# Patient Record
Sex: Female | Born: 1960 | Race: White | Hispanic: No | Marital: Married | Smoking: Never smoker
Health system: Southern US, Community
[De-identification: ages and names within clinical notes are randomized; demographics above are authoritative.]

## PROBLEM LIST (undated history)

## (undated) DIAGNOSIS — G510 Bell's palsy: Secondary | ICD-10-CM

## (undated) DIAGNOSIS — E669 Obesity, unspecified: Secondary | ICD-10-CM

## (undated) DIAGNOSIS — G4700 Insomnia, unspecified: Secondary | ICD-10-CM

## (undated) DIAGNOSIS — R5382 Chronic fatigue, unspecified: Secondary | ICD-10-CM

## (undated) DIAGNOSIS — F32A Depression, unspecified: Secondary | ICD-10-CM

## (undated) DIAGNOSIS — J329 Chronic sinusitis, unspecified: Secondary | ICD-10-CM

## (undated) DIAGNOSIS — F419 Anxiety disorder, unspecified: Secondary | ICD-10-CM

## (undated) DIAGNOSIS — E559 Vitamin D deficiency, unspecified: Secondary | ICD-10-CM

## (undated) DIAGNOSIS — E7800 Pure hypercholesterolemia, unspecified: Secondary | ICD-10-CM

## (undated) DIAGNOSIS — I10 Essential (primary) hypertension: Secondary | ICD-10-CM

## (undated) HISTORY — PX: BREAST BIOPSY: SHX20

## (undated) HISTORY — DX: Chronic sinusitis, unspecified: J32.9

## (undated) HISTORY — DX: Obesity, unspecified: E66.9

## (undated) HISTORY — DX: Vitamin D deficiency, unspecified: E55.9

## (undated) HISTORY — DX: Insomnia, unspecified: G47.00

## (undated) HISTORY — DX: Anxiety disorder, unspecified: F41.9

## (undated) HISTORY — PX: REDUCTION MAMMAPLASTY: SUR839

## (undated) HISTORY — DX: Pure hypercholesterolemia, unspecified: E78.00

## (undated) HISTORY — PX: TONSILLECTOMY: SUR1361

## (undated) HISTORY — DX: Chronic fatigue, unspecified: R53.82

---
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
## 1996-10-27 HISTORY — PX: SMALL INTESTINE SURGERY: SHX150

## 1998-05-21 ENCOUNTER — Inpatient Hospital Stay (HOSPITAL_COMMUNITY): Admission: AD | Admit: 1998-05-21 | Discharge: 1998-05-23 | Payer: Self-pay | Admitting: Obstetrics and Gynecology

## 1998-07-23 ENCOUNTER — Other Ambulatory Visit: Admission: RE | Admit: 1998-07-23 | Discharge: 1998-07-23 | Payer: Self-pay | Admitting: Obstetrics and Gynecology

## 1998-09-07 ENCOUNTER — Other Ambulatory Visit: Admission: RE | Admit: 1998-09-07 | Discharge: 1998-09-07 | Payer: Self-pay | Admitting: Obstetrics and Gynecology

## 1999-03-05 ENCOUNTER — Other Ambulatory Visit: Admission: RE | Admit: 1999-03-05 | Discharge: 1999-03-05 | Payer: Self-pay | Admitting: Obstetrics and Gynecology

## 2000-11-19 ENCOUNTER — Other Ambulatory Visit: Admission: RE | Admit: 2000-11-19 | Discharge: 2000-11-19 | Payer: Self-pay | Admitting: Obstetrics and Gynecology

## 2000-11-26 ENCOUNTER — Encounter: Payer: Self-pay | Admitting: Obstetrics and Gynecology

## 2000-11-26 ENCOUNTER — Encounter: Admission: RE | Admit: 2000-11-26 | Discharge: 2000-11-26 | Payer: Self-pay | Admitting: Obstetrics and Gynecology

## 2001-01-04 ENCOUNTER — Other Ambulatory Visit: Admission: RE | Admit: 2001-01-04 | Discharge: 2001-01-04 | Payer: Self-pay | Admitting: Obstetrics and Gynecology

## 2001-01-04 ENCOUNTER — Encounter (INDEPENDENT_AMBULATORY_CARE_PROVIDER_SITE_OTHER): Payer: Self-pay

## 2001-01-12 ENCOUNTER — Other Ambulatory Visit: Admission: RE | Admit: 2001-01-12 | Discharge: 2001-01-12 | Payer: Self-pay

## 2001-12-15 ENCOUNTER — Other Ambulatory Visit: Admission: RE | Admit: 2001-12-15 | Discharge: 2001-12-15 | Payer: Self-pay | Admitting: Obstetrics and Gynecology

## 2002-05-17 ENCOUNTER — Encounter: Admission: RE | Admit: 2002-05-17 | Discharge: 2002-05-17 | Payer: Self-pay | Admitting: Obstetrics and Gynecology

## 2002-05-17 ENCOUNTER — Encounter: Payer: Self-pay | Admitting: Obstetrics and Gynecology

## 2002-07-20 ENCOUNTER — Encounter: Payer: Self-pay | Admitting: Obstetrics and Gynecology

## 2002-07-20 ENCOUNTER — Ambulatory Visit (HOSPITAL_COMMUNITY): Admission: RE | Admit: 2002-07-20 | Discharge: 2002-07-20 | Payer: Self-pay | Admitting: Obstetrics and Gynecology

## 2003-12-07 ENCOUNTER — Encounter: Admission: RE | Admit: 2003-12-07 | Discharge: 2003-12-07 | Payer: Self-pay | Admitting: Obstetrics and Gynecology

## 2004-04-10 ENCOUNTER — Other Ambulatory Visit: Admission: RE | Admit: 2004-04-10 | Discharge: 2004-04-10 | Payer: Self-pay | Admitting: Obstetrics and Gynecology

## 2004-05-08 ENCOUNTER — Encounter: Admission: RE | Admit: 2004-05-08 | Discharge: 2004-08-06 | Payer: Self-pay | Admitting: Obstetrics and Gynecology

## 2004-12-27 ENCOUNTER — Encounter: Admission: RE | Admit: 2004-12-27 | Discharge: 2004-12-27 | Payer: Self-pay | Admitting: Obstetrics and Gynecology

## 2005-05-02 ENCOUNTER — Other Ambulatory Visit: Admission: RE | Admit: 2005-05-02 | Discharge: 2005-05-02 | Payer: Self-pay | Admitting: Obstetrics and Gynecology

## 2006-01-13 ENCOUNTER — Encounter: Admission: RE | Admit: 2006-01-13 | Discharge: 2006-01-13 | Payer: Self-pay | Admitting: Obstetrics and Gynecology

## 2006-05-05 ENCOUNTER — Other Ambulatory Visit: Admission: RE | Admit: 2006-05-05 | Discharge: 2006-05-05 | Payer: Self-pay | Admitting: Obstetrics and Gynecology

## 2007-02-17 ENCOUNTER — Encounter: Admission: RE | Admit: 2007-02-17 | Discharge: 2007-02-17 | Payer: Self-pay | Admitting: Obstetrics and Gynecology

## 2007-06-24 ENCOUNTER — Other Ambulatory Visit: Admission: RE | Admit: 2007-06-24 | Discharge: 2007-06-24 | Payer: Self-pay | Admitting: Obstetrics and Gynecology

## 2008-02-24 ENCOUNTER — Encounter: Admission: RE | Admit: 2008-02-24 | Discharge: 2008-02-24 | Payer: Self-pay | Admitting: Obstetrics and Gynecology

## 2008-07-14 ENCOUNTER — Other Ambulatory Visit: Admission: RE | Admit: 2008-07-14 | Discharge: 2008-07-14 | Payer: Self-pay | Admitting: Obstetrics and Gynecology

## 2009-03-20 ENCOUNTER — Encounter: Admission: RE | Admit: 2009-03-20 | Discharge: 2009-03-20 | Payer: Self-pay | Admitting: Obstetrics and Gynecology

## 2009-03-23 ENCOUNTER — Encounter: Admission: RE | Admit: 2009-03-23 | Discharge: 2009-03-23 | Payer: Self-pay | Admitting: Obstetrics and Gynecology

## 2009-07-17 ENCOUNTER — Other Ambulatory Visit: Admission: RE | Admit: 2009-07-17 | Discharge: 2009-07-17 | Payer: Self-pay | Admitting: Obstetrics and Gynecology

## 2010-04-11 ENCOUNTER — Encounter: Admission: RE | Admit: 2010-04-11 | Discharge: 2010-04-11 | Payer: Self-pay | Admitting: Obstetrics and Gynecology

## 2010-07-18 ENCOUNTER — Other Ambulatory Visit: Admission: RE | Admit: 2010-07-18 | Discharge: 2010-07-18 | Payer: Self-pay | Admitting: Obstetrics and Gynecology

## 2011-04-28 ENCOUNTER — Other Ambulatory Visit: Payer: Self-pay | Admitting: Obstetrics and Gynecology

## 2011-04-28 DIAGNOSIS — Z1231 Encounter for screening mammogram for malignant neoplasm of breast: Secondary | ICD-10-CM

## 2011-05-08 ENCOUNTER — Ambulatory Visit
Admission: RE | Admit: 2011-05-08 | Discharge: 2011-05-08 | Disposition: A | Discharge status: Home / Self Care | Payer: Managed Care, Other (non HMO) | Source: Ambulatory Visit | Attending: Obstetrics and Gynecology | Admitting: Obstetrics and Gynecology

## 2011-05-08 DIAGNOSIS — Z1231 Encounter for screening mammogram for malignant neoplasm of breast: Secondary | ICD-10-CM

## 2012-06-30 ENCOUNTER — Other Ambulatory Visit: Payer: Self-pay | Admitting: Obstetrics and Gynecology

## 2012-06-30 DIAGNOSIS — Z1231 Encounter for screening mammogram for malignant neoplasm of breast: Secondary | ICD-10-CM

## 2012-07-15 ENCOUNTER — Ambulatory Visit: Payer: Managed Care, Other (non HMO)

## 2012-10-05 ENCOUNTER — Ambulatory Visit
Admission: RE | Admit: 2012-10-05 | Discharge: 2012-10-05 | Disposition: A | Discharge status: Home / Self Care | Payer: Managed Care, Other (non HMO) | Source: Ambulatory Visit | Attending: Obstetrics and Gynecology | Admitting: Obstetrics and Gynecology

## 2012-10-05 DIAGNOSIS — Z1231 Encounter for screening mammogram for malignant neoplasm of breast: Secondary | ICD-10-CM

## 2012-10-28 ENCOUNTER — Other Ambulatory Visit: Payer: Self-pay | Admitting: Nurse Practitioner

## 2012-10-28 ENCOUNTER — Other Ambulatory Visit (HOSPITAL_COMMUNITY)
Admission: RE | Admit: 2012-10-28 | Discharge: 2012-10-28 | Disposition: A | Discharge status: Home / Self Care | Payer: Managed Care, Other (non HMO) | Source: Ambulatory Visit | Attending: Obstetrics and Gynecology | Admitting: Obstetrics and Gynecology

## 2012-10-28 DIAGNOSIS — Z01419 Encounter for gynecological examination (general) (routine) without abnormal findings: Secondary | ICD-10-CM | POA: Insufficient documentation

## 2013-10-27 HISTORY — PX: BUNIONECTOMY: SHX129

## 2014-03-15 ENCOUNTER — Ambulatory Visit
Admission: RE | Admit: 2014-03-15 | Discharge: 2014-03-15 | Disposition: A | Discharge status: Home / Self Care | Payer: Managed Care, Other (non HMO) | Source: Ambulatory Visit | Attending: Family Medicine | Admitting: Family Medicine

## 2014-03-15 ENCOUNTER — Other Ambulatory Visit: Payer: Self-pay | Admitting: Family Medicine

## 2014-03-15 DIAGNOSIS — M25462 Effusion, left knee: Secondary | ICD-10-CM

## 2014-03-15 DIAGNOSIS — M25461 Effusion, right knee: Secondary | ICD-10-CM

## 2014-03-15 DIAGNOSIS — M25469 Effusion, unspecified knee: Secondary | ICD-10-CM

## 2015-01-01 ENCOUNTER — Other Ambulatory Visit: Payer: Self-pay

## 2015-01-01 DIAGNOSIS — Z1231 Encounter for screening mammogram for malignant neoplasm of breast: Secondary | ICD-10-CM

## 2015-01-02 ENCOUNTER — Ambulatory Visit
Admission: RE | Admit: 2015-01-02 | Discharge: 2015-01-02 | Disposition: A | Discharge status: Home / Self Care | Payer: Managed Care, Other (non HMO) | Source: Ambulatory Visit

## 2015-01-02 DIAGNOSIS — Z1231 Encounter for screening mammogram for malignant neoplasm of breast: Secondary | ICD-10-CM

## 2016-06-24 ENCOUNTER — Other Ambulatory Visit: Payer: Self-pay | Admitting: Nurse Practitioner

## 2016-06-24 DIAGNOSIS — Z1231 Encounter for screening mammogram for malignant neoplasm of breast: Secondary | ICD-10-CM

## 2016-07-02 ENCOUNTER — Ambulatory Visit
Admission: RE | Admit: 2016-07-02 | Discharge: 2016-07-02 | Disposition: A | Discharge status: Home / Self Care | Payer: Managed Care, Other (non HMO) | Source: Ambulatory Visit | Attending: Nurse Practitioner | Admitting: Nurse Practitioner

## 2016-07-02 ENCOUNTER — Encounter: Payer: Self-pay | Admitting: Radiology

## 2016-07-02 DIAGNOSIS — Z1231 Encounter for screening mammogram for malignant neoplasm of breast: Secondary | ICD-10-CM

## 2016-07-15 ENCOUNTER — Other Ambulatory Visit (HOSPITAL_COMMUNITY)
Admission: RE | Admit: 2016-07-15 | Discharge: 2016-07-15 | Disposition: A | Discharge status: Home / Self Care | Payer: Managed Care, Other (non HMO) | Source: Ambulatory Visit | Attending: Nurse Practitioner | Admitting: Nurse Practitioner

## 2016-07-15 ENCOUNTER — Other Ambulatory Visit: Payer: Self-pay | Admitting: Nurse Practitioner

## 2016-07-15 DIAGNOSIS — Z1151 Encounter for screening for human papillomavirus (HPV): Secondary | ICD-10-CM | POA: Diagnosis present

## 2016-07-15 DIAGNOSIS — Z01419 Encounter for gynecological examination (general) (routine) without abnormal findings: Secondary | ICD-10-CM | POA: Diagnosis present

## 2016-07-18 LAB — CYTOLOGY - PAP

## 2017-04-21 ENCOUNTER — Encounter (HOSPITAL_COMMUNITY): Payer: Self-pay | Admitting: Emergency Medicine

## 2017-04-21 ENCOUNTER — Emergency Department (HOSPITAL_COMMUNITY): Payer: 59

## 2017-04-21 ENCOUNTER — Emergency Department (HOSPITAL_COMMUNITY)
Admission: EM | Admit: 2017-04-21 | Discharge: 2017-04-21 | Disposition: A | Discharge status: Home / Self Care | Payer: 59 | Attending: Emergency Medicine | Admitting: Emergency Medicine

## 2017-04-21 DIAGNOSIS — I10 Essential (primary) hypertension: Secondary | ICD-10-CM | POA: Diagnosis not present

## 2017-04-21 DIAGNOSIS — Z79899 Other long term (current) drug therapy: Secondary | ICD-10-CM | POA: Insufficient documentation

## 2017-04-21 DIAGNOSIS — R51 Headache: Secondary | ICD-10-CM | POA: Insufficient documentation

## 2017-04-21 DIAGNOSIS — R519 Headache, unspecified: Secondary | ICD-10-CM

## 2017-04-21 HISTORY — DX: Essential (primary) hypertension: I10

## 2017-04-21 MED ORDER — SODIUM CHLORIDE 0.9 % IV BOLUS (SEPSIS)
500.0000 mL | Freq: Once | INTRAVENOUS | Status: AC
  Administered 2017-04-21: 500 mL via INTRAVENOUS

## 2017-04-21 MED ORDER — METOCLOPRAMIDE HCL 5 MG/ML IJ SOLN
10.0000 mg | Freq: Once | INTRAMUSCULAR | Status: AC
  Administered 2017-04-21: 10 mg via INTRAVENOUS
  Filled 2017-04-21: qty 2

## 2017-04-21 MED ORDER — DIPHENHYDRAMINE HCL 50 MG/ML IJ SOLN
25.0000 mg | Freq: Once | INTRAMUSCULAR | Status: AC
  Administered 2017-04-21: 25 mg via INTRAVENOUS
  Filled 2017-04-21: qty 1

## 2017-04-21 MED ORDER — KETOROLAC TROMETHAMINE 15 MG/ML IJ SOLN
15.0000 mg | Freq: Once | INTRAMUSCULAR | Status: AC
  Administered 2017-04-21: 15 mg via INTRAVENOUS
  Filled 2017-04-21: qty 1

## 2017-04-21 MED ORDER — DEXAMETHASONE SODIUM PHOSPHATE 10 MG/ML IJ SOLN
10.0000 mg | Freq: Once | INTRAMUSCULAR | Status: AC
  Administered 2017-04-21: 10 mg via INTRAVENOUS
  Filled 2017-04-21: qty 1

## 2017-04-21 NOTE — Discharge Instructions (Signed)
Please follow up with your family doctor.  Return for worsening symptoms.  °

## 2017-04-21 NOTE — ED Provider Notes (Signed)
[facility] DEPT Provider Note   CSN: [id number] Arrival date & time: 04/21/17  1445  By signing my name below, I, Sonum Patel, attest that this documentation has been prepared under the direction and in the presence of [employer], PA-C. Electronically Signed: Ludger Nutting, Scribe. 04/21/17. 5:52 PM.  History   Chief Complaint Chief Complaint  Patient presents with  . Headache    The history is provided by the patient. No language interpreter was used.     HPI Comments: Alicia Lee is a 56 y.o. female who presents to the Emergency Department complaining of intermittent HA's that began 5 days ago. She currently has pain to the front left region of her head. Initially the headache started when she was swimming. She felt a pop to the posterior aspect of her head which spread to the entire head "like a helmet". The pain felt like a "bomb went off". She went home and felt very tired and slept for the rest of the day. She felt like she had a hangover. The pain was gone on Saturday but then on Sunday returned when she went swimming again. She again slept for the rest of the day and then today she had a 3rd episode while doing house work. She states the day she experiences the pop she sleeps for 14+ hours then the following day states she feels hung over. She tried ibuprofen and sleeping without relief. She reports having 1 migraine about 18 years ago. She denies fever, neck pain, lightheadedness, vision changes, dizziness, syncope, unilateral weakness or numbness. She called her PCP's office who advised her to come to the ED.   Past Medical History:  Diagnosis Date  . Hypertension     There are no active problems to display for this patient.   History reviewed. No pertinent surgical history.  OB History    No data available       Home Medications    Prior to Admission medications   Not on File    Family History History reviewed. No pertinent family history.  Social  History Social History  Substance Use Topics  . Smoking status: Never Smoker  . Smokeless tobacco: Never Used  . Alcohol use Yes     Allergies   Patient has no known allergies.   Review of Systems Review of Systems  Constitutional: Negative for fever.  Eyes: Positive for photophobia. Negative for visual disturbance.  Musculoskeletal: Negative for neck pain.  Neurological: Positive for headaches. Negative for dizziness, syncope, weakness, light-headedness and numbness.  All other systems reviewed and are negative.    Physical Exam Updated Vital Signs BP (!) 164/102 (BP Location: Right Arm)   Pulse (!) 59   Temp 98.1 F (36.7 C) (Oral)   Resp 18   SpO2 97%   Physical Exam  Constitutional: She is oriented to person, place, and time. She appears well-developed and well-nourished.  HENT:  Head: Normocephalic and atraumatic.  No scalp tenderness  Eyes: Conjunctivae and EOM are normal. Pupils are equal, round, and reactive to light.  Neck: Normal range of motion. Neck supple.  Cardiovascular: Normal rate, regular rhythm and normal heart sounds.   Pulmonary/Chest: Effort normal and breath sounds normal. No respiratory distress. She has no wheezes. She has no rales.  Musculoskeletal: Normal range of motion.  Neurological: She is alert and oriented to person, place, and time. She displays normal reflexes. No cranial nerve deficit or sensory deficit. She exhibits normal muscle tone. Coordination normal.  Mental Status:  Alert, oriented, thought content appropriate, able to give a coherent history. Speech fluent without evidence of aphasia. Able to follow 2 step commands without difficulty.  Cranial Nerves:  II:  Peripheral visual fields grossly normal, pupils equal, round, reactive to light III,IV, VI: ptosis not present, extra-ocular motions intact bilaterally  V,VII: smile symmetric, facial light touch sensation equal VIII: hearing grossly normal to voice  X: uvula elevates  symmetrically  XI: bilateral shoulder shrug symmetric and strong XII: midline tongue extension without fassiculations Motor:  Normal tone. 5/5 in upper and lower extremities bilaterally including strong and equal grip strength and dorsiflexion/plantar flexion Sensory: Pinprick and light touch normal in all extremities.  Cerebellar: normal finger-to-nose with bilateral upper extremities Gait: normal gait and balance CV: distal pulses palpable throughout    Skin: Skin is warm and dry.  Psychiatric: She has a normal mood and affect.  Nursing note and vitals reviewed.    ED Treatments / Results  DIAGNOSTIC STUDIES: Oxygen Saturation is 97% on RA, RA by my interpretation.    COORDINATION OF CARE: 5:52 PM Discussed treatment plan with pt at bedside and pt agreed to plan.   Labs (all labs ordered are listed, but only abnormal results are displayed) Labs Reviewed - No data to display  EKG  EKG Interpretation None       Radiology Ct Head Wo Contrast  Result Date: 04/21/2017 CLINICAL DATA:  56 year old female with frontal headache for 5 days. EXAM: CT HEAD WITHOUT CONTRAST TECHNIQUE: Contiguous axial images were obtained from the base of the skull through the vertex without intravenous contrast. COMPARISON:  None. FINDINGS: Brain: No evidence of acute infarction, hemorrhage, hydrocephalus, extra-axial collection or mass lesion/mass effect. Vascular: No hyperdense vessel or unexpected calcification. Skull: Normal. Negative for fracture or focal lesion. Sinuses/Orbits: No acute finding. Other: None. IMPRESSION: Unremarkable noncontrast head CT. Electronically Signed   By: Margarette Canada M.D.   On: 04/21/2017 18:44    Procedures Procedures (including critical care time)  Medications Ordered in ED Medications  sodium chloride 0.9 % bolus 500 mL (500 mLs Intravenous New Bag/Given 04/21/17 1927)  ketorolac (TORADOL) 15 MG/ML injection 15 mg (15 mg Intravenous Given 04/21/17 1927)    metoCLOPramide (REGLAN) injection 10 mg (10 mg Intravenous Given 04/21/17 1930)  diphenhydrAMINE (BENADRYL) injection 25 mg (25 mg Intravenous Given 04/21/17 1931)  dexamethasone (DECADRON) injection 10 mg (10 mg Intravenous Given 04/21/17 1934)     Initial Impression / Assessment and Plan / ED Course  I have reviewed the triage vital signs and the nursing notes.  Pertinent labs & imaging results that were available during my care of the patient were reviewed by me and considered in my medical decision making (see chart for details).  56 year old female presents with intermittent headache. She is hypertensive but otherwise vitals are normal. Neuro exam is unremarkable. There are several concerning features in her history therefore head CT was obtained which was normal. Migraine cocktail was given which provided moderate improvement. Will d/c with PCP follow up. Return precautions given.  Final Clinical Impressions(s) / ED Diagnoses   Final diagnoses:  Bad headache    New Prescriptions New Prescriptions   No medications on file   I personally performed the services described in this documentation, which was scribed in my presence. The recorded information has been reviewed and is accurate.    Recardo Evangelist, PA-C 04/21/17 2036    Davonna Belling, MD 04/21/17 [phone]

## 2017-04-21 NOTE — ED Triage Notes (Signed)
Pt here with intermittent HA x 1 week worse after doing water aerobics; pt sts pain starts in back of head and goes around to front

## 2017-04-21 NOTE — ED Notes (Addendum)
Pt was sent from [facility] with c/o occipital "thunderclap" headache and nausea, onset last Thursday. [facility] was concerned about subarachnoid bleed.

## 2017-04-21 NOTE — ED Notes (Signed)
Pt to CT at this time.

## 2017-06-19 ENCOUNTER — Ambulatory Visit: Payer: 59 | Admitting: Diagnostic Neuroimaging

## 2017-10-30 ENCOUNTER — Other Ambulatory Visit: Payer: Self-pay | Admitting: Nurse Practitioner

## 2017-10-30 ENCOUNTER — Ambulatory Visit
Admission: RE | Admit: 2017-10-30 | Discharge: 2017-10-30 | Disposition: A | Discharge status: Home / Self Care | Payer: 59 | Source: Ambulatory Visit | Attending: Nurse Practitioner | Admitting: Nurse Practitioner

## 2017-10-30 DIAGNOSIS — Z1231 Encounter for screening mammogram for malignant neoplasm of breast: Secondary | ICD-10-CM

## 2018-08-20 ENCOUNTER — Other Ambulatory Visit: Payer: Self-pay | Admitting: Nurse Practitioner

## 2018-08-20 DIAGNOSIS — Z13820 Encounter for screening for osteoporosis: Secondary | ICD-10-CM

## 2018-08-26 ENCOUNTER — Other Ambulatory Visit: Payer: 59

## 2018-10-01 ENCOUNTER — Other Ambulatory Visit: Payer: Self-pay | Admitting: Family Medicine

## 2018-10-01 DIAGNOSIS — M25511 Pain in right shoulder: Secondary | ICD-10-CM

## 2018-10-15 ENCOUNTER — Other Ambulatory Visit: Payer: 59

## 2018-10-27 HISTORY — PX: SHOULDER ARTHROSCOPY: SHX128

## 2018-10-29 ENCOUNTER — Ambulatory Visit
Admission: RE | Admit: 2018-10-29 | Discharge: 2018-10-29 | Disposition: A | Discharge status: Home / Self Care | Payer: 59 | Source: Ambulatory Visit | Attending: Family Medicine | Admitting: Family Medicine

## 2018-10-29 DIAGNOSIS — M25511 Pain in right shoulder: Secondary | ICD-10-CM

## 2019-01-11 ENCOUNTER — Other Ambulatory Visit: Payer: Self-pay | Admitting: Obstetrics & Gynecology

## 2019-01-11 DIAGNOSIS — N6320 Unspecified lump in the left breast, unspecified quadrant: Secondary | ICD-10-CM

## 2019-01-13 ENCOUNTER — Other Ambulatory Visit: Payer: Self-pay

## 2019-01-13 ENCOUNTER — Ambulatory Visit
Admission: RE | Admit: 2019-01-13 | Discharge: 2019-01-13 | Disposition: A | Discharge status: Home / Self Care | Payer: 59 | Source: Ambulatory Visit | Attending: Obstetrics & Gynecology | Admitting: Obstetrics & Gynecology

## 2019-01-13 DIAGNOSIS — N6320 Unspecified lump in the left breast, unspecified quadrant: Secondary | ICD-10-CM

## 2019-09-06 ENCOUNTER — Other Ambulatory Visit (HOSPITAL_COMMUNITY)
Admission: RE | Admit: 2019-09-06 | Discharge: 2019-09-06 | Disposition: A | Discharge status: Home / Self Care | Payer: 59 | Source: Ambulatory Visit | Attending: Nurse Practitioner | Admitting: Nurse Practitioner

## 2019-09-06 ENCOUNTER — Other Ambulatory Visit: Payer: Self-pay | Admitting: Nurse Practitioner

## 2019-09-06 DIAGNOSIS — Z124 Encounter for screening for malignant neoplasm of cervix: Secondary | ICD-10-CM | POA: Diagnosis present

## 2019-09-07 LAB — CYTOLOGY - PAP
Comment: NEGATIVE
Diagnosis: NEGATIVE
High risk HPV: NEGATIVE

## 2020-03-14 ENCOUNTER — Emergency Department (HOSPITAL_COMMUNITY)
Admission: EM | Admit: 2020-03-14 | Discharge: 2020-03-14 | Disposition: A | Discharge status: Home / Self Care | Payer: 59 | Attending: Emergency Medicine | Admitting: Emergency Medicine

## 2020-03-14 ENCOUNTER — Emergency Department (HOSPITAL_COMMUNITY): Payer: 59

## 2020-03-14 ENCOUNTER — Encounter (HOSPITAL_COMMUNITY): Payer: Self-pay | Admitting: Emergency Medicine

## 2020-03-14 ENCOUNTER — Other Ambulatory Visit: Payer: Self-pay

## 2020-03-14 DIAGNOSIS — X501XXA Overexertion from prolonged static or awkward postures, initial encounter: Secondary | ICD-10-CM | POA: Diagnosis not present

## 2020-03-14 DIAGNOSIS — S92212A Displaced fracture of cuboid bone of left foot, initial encounter for closed fracture: Secondary | ICD-10-CM | POA: Diagnosis not present

## 2020-03-14 DIAGNOSIS — S99912A Unspecified injury of left ankle, initial encounter: Secondary | ICD-10-CM | POA: Diagnosis present

## 2020-03-14 DIAGNOSIS — I10 Essential (primary) hypertension: Secondary | ICD-10-CM | POA: Insufficient documentation

## 2020-03-14 DIAGNOSIS — Y999 Unspecified external cause status: Secondary | ICD-10-CM | POA: Insufficient documentation

## 2020-03-14 DIAGNOSIS — Y92821 Forest as the place of occurrence of the external cause: Secondary | ICD-10-CM | POA: Diagnosis not present

## 2020-03-14 DIAGNOSIS — Y93K1 Activity, walking an animal: Secondary | ICD-10-CM | POA: Insufficient documentation

## 2020-03-14 MED ORDER — OXYCODONE HCL 5 MG PO TABS: 5.0000 mg | ORAL_TABLET | ORAL | 0 refills | Status: DC | PRN

## 2020-03-14 NOTE — ED Triage Notes (Signed)
Pt reports that she was walking her dog through the woods when he jump a creek and then she tried jumping the creek reports twisted her left ankle and heard popping and crunching. Having pain in left ankle radiating down into foot.

## 2020-03-14 NOTE — ED Provider Notes (Signed)
[facility] DEPT Provider Note   CSN: [id number] Arrival date & time: 03/14/20  0946     History Chief Complaint  Patient presents with  . Ankle Pain    Alicia Lee is a 59 y.o. female.  HPI      59yo female with history of hypertension presents with concern for ankle pain. Reports she was walking her dog through the woods of [place] park when he jumped a dried creek bed and she tried jumping over and twisted her left ankle. She heard a popping and crunching. Has put some weight on it since. Pain severe, radiating up towards the knee and down towards foot. No other injuries or pain, no neck pain, headache, LOC. Not on anticoagulation. Had tingling in toes of left foot. No other injuries.  Past Medical History:  Diagnosis Date  . Hypertension     There are no problems to display for this patient.   Past Surgical History:  Procedure Laterality Date  . BREAST BIOPSY Left      OB History   No obstetric history on file.     Family History  Problem Relation Age of Onset  . Breast cancer Neg Hx     Social History   Tobacco Use  . Smoking status: Never Smoker  . Smokeless tobacco: Never Used  Substance Use Topics  . Alcohol use: Yes  . Drug use: No    Home Medications Prior to Admission medications   Medication Sig Start Date End Date Taking? Authorizing Provider  oxyCODONE (ROXICODONE) 5 MG immediate release tablet Take 1 tablet (5 mg total) by mouth every 4 (four) hours as needed for severe pain. 03/14/20   Gareth Morgan, MD    Allergies    Patient has no known allergies.  Review of Systems   Review of Systems  Constitutional: Negative for fever.  Respiratory: Negative for shortness of breath.   Cardiovascular: Negative for chest pain.  Gastrointestinal: Negative for abdominal pain.  Musculoskeletal: Positive for arthralgias. Negative for neck pain.  Skin: Negative for wound.  Neurological: Negative for syncope and  headaches.    Physical Exam Updated Vital Signs BP 108/79   Pulse 72   Temp (!) 97.4 F (36.3 C) (Oral)   Resp 18   SpO2 97%   Physical Exam Vitals and nursing note reviewed.  Constitutional:      General: She is not in acute distress.    Appearance: Normal appearance. She is not ill-appearing, toxic-appearing or diaphoretic.  HENT:     Head: Normocephalic.  Eyes:     Conjunctiva/sclera: Conjunctivae normal.  Cardiovascular:     Rate and Rhythm: Normal rate and regular rhythm.     Pulses: Normal pulses.  Pulmonary:     Effort: Pulmonary effort is normal. No respiratory distress.  Musculoskeletal:        General: Swelling (left lateral foot) and tenderness (left lateral foot and ankle) present. No deformity or signs of injury.     Cervical back: No rigidity.  Skin:    General: Skin is warm and dry.     Coloration: Skin is not jaundiced or pale.  Neurological:     General: No focal deficit present.     Mental Status: She is alert and oriented to person, place, and time.     ED Results / Procedures / Treatments   Labs (all labs ordered are listed, but only abnormal results are displayed) Labs Reviewed - No data to display  EKG None  Radiology DG Ankle Complete Left  Result Date: 03/14/2020 CLINICAL DATA:  Left foot and ankle pain after twisting injury EXAM: LEFT ANKLE COMPLETE - 3+ VIEW; LEFT FOOT - COMPLETE 3+ VIEW COMPARISON:  None. FINDINGS: There is a tiny minimally displaced avulsion fracture from the lateral cortex of the cuboid adjacent to the calcaneocuboid joint. Osseous structures appear otherwise intact. Likely chronic postoperative changes to the first metatarsal head. Ankle mortise is congruent. Soft tissues appear within normal limits. IMPRESSION: Tiny minimally displaced avulsion fracture from the lateral cuboid. Electronically Signed   By: Davina Poke D.O.   On: 03/14/2020 10:37   DG Foot Complete Left  Result Date: 03/14/2020 CLINICAL DATA:   Left foot and ankle pain after twisting injury EXAM: LEFT ANKLE COMPLETE - 3+ VIEW; LEFT FOOT - COMPLETE 3+ VIEW COMPARISON:  None. FINDINGS: There is a tiny minimally displaced avulsion fracture from the lateral cortex of the cuboid adjacent to the calcaneocuboid joint. Osseous structures appear otherwise intact. Likely chronic postoperative changes to the first metatarsal head. Ankle mortise is congruent. Soft tissues appear within normal limits. IMPRESSION: Tiny minimally displaced avulsion fracture from the lateral cuboid. Electronically Signed   By: Davina Poke D.O.   On: 03/14/2020 10:37    Procedures Procedures (including critical care time)  Medications Ordered in ED Medications - No data to display  ED Course  I have reviewed the triage vital signs and the nursing notes.  Pertinent labs & imaging results that were available during my care of the patient were reviewed by me and considered in my medical decision making (see chart for details).    MDM Rules/Calculators/A&P                       Very pleasant 59yo female with history of hypertension presents with concern for ankle pain.  NV intact. No signs of other injury by history.   XR shows cuboid fracture. Discussed with on call provider for [facility], Grier Mitts PA-C who spoke with Dr. Tamera Punt and report patient is ok to be in post-op shoe and weight bearing as tolerated. Reviewed in [place] drug database and given rx for oxycodone for pain--recommend primarily using tylenol/ibuprofen.  Pt to follow up with [facility] and already has called to make an appointment.  Patient discharged in stable condition with understanding of reasons to return.    Final Clinical Impression(s) / ED Diagnoses Final diagnoses:  Closed displaced fracture of cuboid of left foot, initial encounter    Rx / DC Orders ED Discharge Orders         Ordered    oxyCODONE (ROXICODONE) 5 MG immediate release tablet  Every 4  hours PRN     03/14/20 1121           Gareth Morgan, MD 03/14/20 2142

## 2020-08-07 ENCOUNTER — Ambulatory Visit: Payer: 59 | Attending: Internal Medicine

## 2020-08-07 DIAGNOSIS — Z23 Encounter for immunization: Secondary | ICD-10-CM

## 2020-08-07 NOTE — Progress Notes (Signed)
   Covid-19 Vaccination Clinic  Name:  Catherine Cubero    MRN: [id number] DOB: 04/24/1961  08/07/2020  Ms. Wan was observed post Covid-19 immunization for 15 minutes without incident. She was provided with Vaccine Information Sheet and instruction to access the V-Safe system.   Ms. Maultsby was instructed to call 911 with any severe reactions post vaccine: Marland Kitchen Difficulty breathing  . Swelling of face and throat  . A fast heartbeat  . A bad rash all over body  . Dizziness and weakness

## 2020-09-26 ENCOUNTER — Other Ambulatory Visit: Payer: Self-pay | Admitting: Family Medicine

## 2020-09-26 DIAGNOSIS — Z1231 Encounter for screening mammogram for malignant neoplasm of breast: Secondary | ICD-10-CM

## 2020-10-03 ENCOUNTER — Ambulatory Visit
Admission: RE | Admit: 2020-10-03 | Discharge: 2020-10-03 | Disposition: A | Discharge status: Home / Self Care | Payer: 59 | Source: Ambulatory Visit | Attending: Family Medicine | Admitting: Family Medicine

## 2020-10-03 ENCOUNTER — Other Ambulatory Visit: Payer: Self-pay

## 2020-10-03 DIAGNOSIS — Z1231 Encounter for screening mammogram for malignant neoplasm of breast: Secondary | ICD-10-CM

## 2020-11-08 ENCOUNTER — Ambulatory Visit: Payer: 59

## 2020-12-10 ENCOUNTER — Ambulatory Visit: Payer: Self-pay | Admitting: Plastic Surgery

## 2020-12-10 MED ORDER — TRANEXAMIC ACID 1000 MG/10ML IV SOLN: 1000.0000 mg | INTRAVENOUS | Status: AC

## 2020-12-18 ENCOUNTER — Encounter (HOSPITAL_BASED_OUTPATIENT_CLINIC_OR_DEPARTMENT_OTHER): Payer: Self-pay | Admitting: Plastic Surgery

## 2020-12-18 ENCOUNTER — Other Ambulatory Visit: Payer: Self-pay

## 2020-12-20 ENCOUNTER — Encounter (HOSPITAL_BASED_OUTPATIENT_CLINIC_OR_DEPARTMENT_OTHER)
Admission: RE | Admit: 2020-12-20 | Discharge: 2020-12-20 | Disposition: A | Discharge status: Home / Self Care | Payer: 59 | Source: Ambulatory Visit | Attending: Plastic Surgery | Admitting: Plastic Surgery

## 2020-12-20 DIAGNOSIS — Z01810 Encounter for preprocedural cardiovascular examination: Secondary | ICD-10-CM | POA: Diagnosis present

## 2020-12-20 DIAGNOSIS — I10 Essential (primary) hypertension: Secondary | ICD-10-CM | POA: Diagnosis not present

## 2020-12-21 ENCOUNTER — Other Ambulatory Visit (HOSPITAL_COMMUNITY)
Admission: RE | Admit: 2020-12-21 | Discharge: 2020-12-21 | Disposition: A | Discharge status: Home / Self Care | Payer: 59 | Source: Ambulatory Visit | Attending: Plastic Surgery | Admitting: Plastic Surgery

## 2020-12-21 DIAGNOSIS — Z029 Encounter for administrative examinations, unspecified: Secondary | ICD-10-CM | POA: Insufficient documentation

## 2020-12-21 LAB — SARS CORONAVIRUS 2 (TAT 6-24 HRS): SARS Coronavirus 2: NEGATIVE

## 2020-12-24 NOTE — Anesthesia Preprocedure Evaluation (Addendum)
Anesthesia Evaluation  Patient identified by MRN, date of birth, ID band Patient awake    Reviewed: Allergy & Precautions, NPO status , Patient's Chart, lab work & pertinent test results  Airway Mallampati: II  TM Distance: >3 FB Neck ROM: Full    Dental no notable dental hx. (+) Teeth Intact, Dental Advisory Given   Pulmonary neg pulmonary ROS,    Pulmonary exam normal breath sounds clear to auscultation       Cardiovascular hypertension, Pt. on medications Normal cardiovascular exam Rhythm:Regular Rate:Normal     Neuro/Psych Depression  Neuromuscular disease negative psych ROS   GI/Hepatic negative GI ROS, Neg liver ROS,   Endo/Other  negative endocrine ROS  Renal/GU negative Renal ROS     Musculoskeletal negative musculoskeletal ROS (+)   Abdominal   Peds  Hematology   Anesthesia Other Findings   Reproductive/Obstetrics                            Anesthesia Physical Anesthesia Plan  ASA: II  Anesthesia Plan: General   Post-op Pain Management:    Induction: Intravenous  PONV Risk Score and Plan: 4 or greater and Treatment may vary due to age or medical condition, Midazolam, Dexamethasone, Ondansetron and Amisulpride  Airway Management Planned: Oral ETT  Additional Equipment: None  Intra-op Plan:   Post-operative Plan: Extubation in OR  Informed Consent: I have reviewed the patients History and Physical, chart, labs and discussed the procedure including the risks, benefits and alternatives for the proposed anesthesia with the patient or authorized representative who has indicated his/her understanding and acceptance.     Dental advisory given  Plan Discussed with: CRNA and Anesthesiologist  Anesthesia Plan Comments:        Anesthesia Quick Evaluation

## 2020-12-25 ENCOUNTER — Ambulatory Visit (HOSPITAL_BASED_OUTPATIENT_CLINIC_OR_DEPARTMENT_OTHER)
Admission: RE | Admit: 2020-12-25 | Discharge: 2020-12-25 | Disposition: A | Discharge status: Home / Self Care | Payer: 59 | Attending: Plastic Surgery | Admitting: Plastic Surgery

## 2020-12-25 ENCOUNTER — Encounter (HOSPITAL_BASED_OUTPATIENT_CLINIC_OR_DEPARTMENT_OTHER): Payer: Self-pay | Admitting: Plastic Surgery

## 2020-12-25 ENCOUNTER — Ambulatory Visit (HOSPITAL_BASED_OUTPATIENT_CLINIC_OR_DEPARTMENT_OTHER): Payer: 59 | Admitting: Certified Registered"

## 2020-12-25 ENCOUNTER — Other Ambulatory Visit: Payer: Self-pay

## 2020-12-25 ENCOUNTER — Encounter (HOSPITAL_BASED_OUTPATIENT_CLINIC_OR_DEPARTMENT_OTHER)
Admission: RE | Disposition: A | Discharge status: Home / Self Care | Payer: Self-pay | Source: Home / Self Care | Attending: Plastic Surgery

## 2020-12-25 DIAGNOSIS — D241 Benign neoplasm of right breast: Secondary | ICD-10-CM | POA: Insufficient documentation

## 2020-12-25 DIAGNOSIS — N62 Hypertrophy of breast: Secondary | ICD-10-CM | POA: Diagnosis not present

## 2020-12-25 DIAGNOSIS — N6489 Other specified disorders of breast: Secondary | ICD-10-CM | POA: Diagnosis not present

## 2020-12-25 DIAGNOSIS — Z79899 Other long term (current) drug therapy: Secondary | ICD-10-CM | POA: Insufficient documentation

## 2020-12-25 HISTORY — PX: BREAST REDUCTION SURGERY: SHX8

## 2020-12-25 HISTORY — DX: Depression, unspecified: F32.A

## 2020-12-25 HISTORY — DX: Bell's palsy: G51.0

## 2020-12-25 SURGERY — BREAST REDUCTION WITH LIPOSUCTION
Anesthesia: General | Site: Breast | Laterality: Bilateral

## 2020-12-25 MED ORDER — SODIUM CHLORIDE 0.9 % IV SOLN
INTRAVENOUS | Status: DC | PRN
  Administered 2020-12-25: 170 mL

## 2020-12-25 MED ORDER — PROPOFOL 500 MG/50ML IV EMUL
INTRAVENOUS | Status: AC
  Filled 2020-12-25: qty 100

## 2020-12-25 MED ORDER — LIDOCAINE HCL (CARDIAC) PF 100 MG/5ML IV SOSY
PREFILLED_SYRINGE | INTRAVENOUS | Status: DC | PRN
  Administered 2020-12-25: 60 mg via INTRAVENOUS

## 2020-12-25 MED ORDER — SODIUM BICARBONATE 4 % IV SOLN
INTRAVENOUS | Status: DC | PRN
  Administered 2020-12-25: 700 mL via INTRAMUSCULAR

## 2020-12-25 MED ORDER — HYDROMORPHONE HCL 1 MG/ML IJ SOLN: 0.2500 mg | INTRAMUSCULAR | Status: DC | PRN

## 2020-12-25 MED ORDER — FENTANYL CITRATE (PF) 100 MCG/2ML IJ SOLN
INTRAMUSCULAR | Status: AC
  Filled 2020-12-25: qty 2

## 2020-12-25 MED ORDER — MIDAZOLAM HCL 5 MG/5ML IJ SOLN
INTRAMUSCULAR | Status: DC | PRN
  Administered 2020-12-25: 2 mg via INTRAVENOUS

## 2020-12-25 MED ORDER — CEFAZOLIN SODIUM-DEXTROSE 2-4 GM/100ML-% IV SOLN
2.0000 g | INTRAVENOUS | Status: AC
  Administered 2020-12-25: 2 g via INTRAVENOUS

## 2020-12-25 MED ORDER — SODIUM CHLORIDE (PF) 0.9 % IJ SOLN
INTRAMUSCULAR | Status: DC | PRN
  Administered 2020-12-25: 60 mL

## 2020-12-25 MED ORDER — ROCURONIUM BROMIDE 100 MG/10ML IV SOLN
INTRAVENOUS | Status: DC | PRN
  Administered 2020-12-25: 70 mg via INTRAVENOUS

## 2020-12-25 MED ORDER — LIDOCAINE-EPINEPHRINE 1 %-1:100000 IJ SOLN
INTRAMUSCULAR | Status: AC
  Filled 2020-12-25: qty 1

## 2020-12-25 MED ORDER — PROPOFOL 10 MG/ML IV BOLUS
INTRAVENOUS | Status: DC | PRN
  Administered 2020-12-25: 120 mg via INTRAVENOUS

## 2020-12-25 MED ORDER — ACETAMINOPHEN 10 MG/ML IV SOLN: 1000.0000 mg | Freq: Once | INTRAVENOUS | Status: DC | PRN

## 2020-12-25 MED ORDER — BUPIVACAINE-EPINEPHRINE (PF) 0.25% -1:200000 IJ SOLN
INTRAMUSCULAR | Status: AC
  Filled 2020-12-25: qty 30

## 2020-12-25 MED ORDER — TRANEXAMIC ACID-NACL 1000-0.7 MG/100ML-% IV SOLN
INTRAVENOUS | Status: AC
  Filled 2020-12-25: qty 100

## 2020-12-25 MED ORDER — DEXAMETHASONE SODIUM PHOSPHATE 4 MG/ML IJ SOLN
INTRAMUSCULAR | Status: DC | PRN
  Administered 2020-12-25: 10 mg via INTRAVENOUS

## 2020-12-25 MED ORDER — PROPOFOL 10 MG/ML IV BOLUS
INTRAVENOUS | Status: AC
  Filled 2020-12-25: qty 20

## 2020-12-25 MED ORDER — OXYCODONE HCL 5 MG PO TABS: 5.0000 mg | ORAL_TABLET | Freq: Once | ORAL | Status: DC | PRN

## 2020-12-25 MED ORDER — EPHEDRINE SULFATE 50 MG/ML IJ SOLN
INTRAMUSCULAR | Status: DC | PRN
  Administered 2020-12-25 (×2): 20 mg via INTRAVENOUS

## 2020-12-25 MED ORDER — MIDAZOLAM HCL 2 MG/2ML IJ SOLN
INTRAMUSCULAR | Status: AC
  Filled 2020-12-25: qty 2

## 2020-12-25 MED ORDER — SUGAMMADEX SODIUM 200 MG/2ML IV SOLN
INTRAVENOUS | Status: DC | PRN
  Administered 2020-12-25: 200 mg via INTRAVENOUS

## 2020-12-25 MED ORDER — BSS IO SOLN
15.0000 mL | Freq: Once | INTRAOCULAR | Status: AC
  Administered 2020-12-25: 15 mL

## 2020-12-25 MED ORDER — BUPIVACAINE LIPOSOME 1.3 % IJ SUSP
INTRAMUSCULAR | Status: AC
  Filled 2020-12-25: qty 20

## 2020-12-25 MED ORDER — LIDOCAINE HCL (PF) 1 % IJ SOLN
INTRAMUSCULAR | Status: AC
  Filled 2020-12-25: qty 30

## 2020-12-25 MED ORDER — CHLORHEXIDINE GLUCONATE CLOTH 2 % EX PADS: 6.0000 | MEDICATED_PAD | Freq: Once | CUTANEOUS | Status: DC

## 2020-12-25 MED ORDER — KETOROLAC TROMETHAMINE 0.5 % OP SOLN
OPHTHALMIC | Status: AC
  Filled 2020-12-25: qty 5

## 2020-12-25 MED ORDER — OXYCODONE HCL 5 MG/5ML PO SOLN: 5.0000 mg | Freq: Once | ORAL | Status: DC | PRN

## 2020-12-25 MED ORDER — EPINEPHRINE PF 1 MG/ML IJ SOLN
INTRAMUSCULAR | Status: AC
  Filled 2020-12-25: qty 1

## 2020-12-25 MED ORDER — PROPOFOL 500 MG/50ML IV EMUL
INTRAVENOUS | Status: AC
  Filled 2020-12-25: qty 50

## 2020-12-25 MED ORDER — LIDOCAINE 2% (20 MG/ML) 5 ML SYRINGE
INTRAMUSCULAR | Status: AC
  Filled 2020-12-25: qty 5

## 2020-12-25 MED ORDER — CIPROFLOXACIN HCL 0.3 % OP SOLN
OPHTHALMIC | Status: AC
  Filled 2020-12-25: qty 2.5

## 2020-12-25 MED ORDER — EPHEDRINE 5 MG/ML INJ
INTRAVENOUS | Status: AC
  Filled 2020-12-25: qty 10

## 2020-12-25 MED ORDER — BSS IO SOLN
INTRAOCULAR | Status: AC
  Filled 2020-12-25: qty 15

## 2020-12-25 MED ORDER — AMISULPRIDE (ANTIEMETIC) 5 MG/2ML IV SOLN: 10.0000 mg | Freq: Once | INTRAVENOUS | Status: DC | PRN

## 2020-12-25 MED ORDER — ONDANSETRON HCL 4 MG/2ML IJ SOLN
INTRAMUSCULAR | Status: DC | PRN
  Administered 2020-12-25: 4 mg via INTRAVENOUS

## 2020-12-25 MED ORDER — PHENYLEPHRINE 40 MCG/ML (10ML) SYRINGE FOR IV PUSH (FOR BLOOD PRESSURE SUPPORT)
PREFILLED_SYRINGE | INTRAVENOUS | Status: AC
  Filled 2020-12-25: qty 30

## 2020-12-25 MED ORDER — ONDANSETRON HCL 4 MG/2ML IJ SOLN: 4.0000 mg | Freq: Once | INTRAMUSCULAR | Status: DC | PRN

## 2020-12-25 MED ORDER — KETOROLAC TROMETHAMINE 0.5 % OP SOLN
1.0000 [drp] | Freq: Three times a day (TID) | OPHTHALMIC | Status: DC | PRN
  Administered 2020-12-25: 1 [drp] via OPHTHALMIC

## 2020-12-25 MED ORDER — ONDANSETRON HCL 4 MG/2ML IJ SOLN
INTRAMUSCULAR | Status: AC
  Filled 2020-12-25: qty 4

## 2020-12-25 MED ORDER — EPHEDRINE 5 MG/ML INJ
INTRAVENOUS | Status: AC
  Filled 2020-12-25: qty 20

## 2020-12-25 MED ORDER — DEXAMETHASONE SODIUM PHOSPHATE 10 MG/ML IJ SOLN
INTRAMUSCULAR | Status: AC
  Filled 2020-12-25: qty 2

## 2020-12-25 MED ORDER — LIDOCAINE 2% (20 MG/ML) 5 ML SYRINGE
INTRAMUSCULAR | Status: AC
  Filled 2020-12-25: qty 10

## 2020-12-25 MED ORDER — SCOPOLAMINE 1 MG/3DAYS TD PT72
MEDICATED_PATCH | TRANSDERMAL | Status: AC
  Filled 2020-12-25: qty 1

## 2020-12-25 MED ORDER — LACTATED RINGERS IV SOLN: INTRAVENOUS | Status: DC

## 2020-12-25 MED ORDER — ONDANSETRON HCL 4 MG/2ML IJ SOLN
INTRAMUSCULAR | Status: AC
  Filled 2020-12-25: qty 6

## 2020-12-25 MED ORDER — DEXMEDETOMIDINE (PRECEDEX) IN NS 20 MCG/5ML (4 MCG/ML) IV SYRINGE
PREFILLED_SYRINGE | INTRAVENOUS | Status: DC | PRN
  Administered 2020-12-25: 12 ug via INTRAVENOUS

## 2020-12-25 MED ORDER — FENTANYL CITRATE (PF) 100 MCG/2ML IJ SOLN
INTRAMUSCULAR | Status: DC | PRN
  Administered 2020-12-25 (×6): 50 ug via INTRAVENOUS

## 2020-12-25 MED ORDER — CIPROFLOXACIN HCL 0.3 % OP SOLN
1.0000 [drp] | Freq: Three times a day (TID) | OPHTHALMIC | Status: DC
  Administered 2020-12-25: 1 [drp] via OPHTHALMIC

## 2020-12-25 MED ORDER — CEFAZOLIN SODIUM-DEXTROSE 2-4 GM/100ML-% IV SOLN
INTRAVENOUS | Status: AC
  Filled 2020-12-25: qty 100

## 2020-12-25 MED ORDER — SCOPOLAMINE 1 MG/3DAYS TD PT72
1.0000 | MEDICATED_PATCH | TRANSDERMAL | Status: DC
  Administered 2020-12-25: 1.5 mg via TRANSDERMAL

## 2020-12-25 MED ORDER — PROPOFOL 500 MG/50ML IV EMUL
INTRAVENOUS | Status: DC | PRN
  Administered 2020-12-25: 35 ug/kg/min via INTRAVENOUS

## 2020-12-25 MED ORDER — BUPIVACAINE HCL (PF) 0.25 % IJ SOLN
INTRAMUSCULAR | Status: AC
  Filled 2020-12-25: qty 30

## 2020-12-25 MED ORDER — BACITRACIN ZINC 500 UNIT/GM EX OINT
TOPICAL_OINTMENT | CUTANEOUS | Status: AC
  Filled 2020-12-25: qty 28.35

## 2020-12-25 SURGICAL SUPPLY — 59 items
BAG DECANTER FOR FLEXI CONT (MISCELLANEOUS) ×3 IMPLANT
BLADE KNIFE PERSONA 10 (BLADE) ×12 IMPLANT
BLADE KNIFE PERSONA 15 (BLADE) ×7 IMPLANT
BNDG GAUZE ELAST 4 BULKY (GAUZE/BANDAGES/DRESSINGS) ×6 IMPLANT
CANISTER SUCT 1200ML W/VALVE (MISCELLANEOUS) ×3 IMPLANT
CAP BOUFFANT 24 BLUE NURSES (PROTECTIVE WEAR) ×3 IMPLANT
COVER BACK TABLE 60X90IN (DRAPES) ×3 IMPLANT
COVER MAYO STAND STRL (DRAPES) ×3 IMPLANT
COVER WAND RF STERILE (DRAPES) IMPLANT
DECANTER SPIKE VIAL GLASS SM (MISCELLANEOUS) ×8 IMPLANT
DRAIN CHANNEL 10F 3/8 F FF (DRAIN) ×6 IMPLANT
DRAPE U-SHAPE 76X120 STRL (DRAPES) ×6 IMPLANT
DRSG EMULSION OIL 3X3 NADH (GAUZE/BANDAGES/DRESSINGS) ×6 IMPLANT
DRSG PAD ABDOMINAL 8X10 ST (GAUZE/BANDAGES/DRESSINGS) ×6 IMPLANT
ELECT BLADE 4.0 EZ CLEAN MEGAD (MISCELLANEOUS) ×3
ELECT REM PT RETURN 9FT ADLT (ELECTROSURGICAL) ×3
ELECTRODE BLDE 4.0 EZ CLN MEGD (MISCELLANEOUS) ×2 IMPLANT
ELECTRODE REM PT RTRN 9FT ADLT (ELECTROSURGICAL) ×2 IMPLANT
EVACUATOR SILICONE 100CC (DRAIN) ×6 IMPLANT
GAUZE SPONGE 4X4 12PLY STRL (GAUZE/BANDAGES/DRESSINGS) ×6 IMPLANT
GLOVE SURG ENC MOIS LTX SZ7 (GLOVE) ×3 IMPLANT
GOWN STRL REUS W/ TWL LRG LVL3 (GOWN DISPOSABLE) ×5 IMPLANT
GOWN STRL REUS W/TWL LRG LVL3 (GOWN DISPOSABLE) ×9
NDL HYPO 25X1 1.5 SAFETY (NEEDLE) ×3 IMPLANT
NDL SAFETY ECLIPSE 18X1.5 (NEEDLE) ×2 IMPLANT
NDL SPNL 18GX3.5 QUINCKE PK (NEEDLE) ×1 IMPLANT
NEEDLE HYPO 18GX1.5 SHARP (NEEDLE) ×3
NEEDLE HYPO 25X1 1.5 SAFETY (NEEDLE) ×9 IMPLANT
NEEDLE SPNL 18GX3.5 QUINCKE PK (NEEDLE) IMPLANT
NS IRRIG 1000ML POUR BTL (IV SOLUTION) ×6 IMPLANT
PACK BASIN DAY SURGERY FS (CUSTOM PROCEDURE TRAY) ×3 IMPLANT
PAD FOAM SILICONE BACKED (GAUZE/BANDAGES/DRESSINGS) ×3 IMPLANT
PENCIL BUTTON HOLSTER BLD 10FT (ELECTRODE) ×2 IMPLANT
PENCIL SMOKE EVACUATOR (MISCELLANEOUS) ×3 IMPLANT
PIN SAFETY STERILE (MISCELLANEOUS) ×3 IMPLANT
SCRUB TECHNI CARE 4 OZ NO DYE (MISCELLANEOUS) ×3 IMPLANT
SLEEVE SCD COMPRESS KNEE MED (STOCKING) ×3 IMPLANT
SPONGE LAP 18X18 RF (DISPOSABLE) ×13 IMPLANT
STAPLER VISISTAT 35W (STAPLE) ×3 IMPLANT
STRIP CLOSURE SKIN 1/2X4 (GAUZE/BANDAGES/DRESSINGS) ×12 IMPLANT
SUT ETHILON 3 0 PS 1 (SUTURE) ×1 IMPLANT
SUT MNCRL AB 3-0 PS2 18 (SUTURE) ×12 IMPLANT
SUT MNCRL AB 4-0 PS2 18 (SUTURE) ×6 IMPLANT
SUT MON AB 5-0 PS2 18 (SUTURE) ×6 IMPLANT
SUT PROLENE 2 0 CT2 30 (SUTURE) ×3 IMPLANT
SUT PROLENE 3 0 PS 1 (SUTURE) ×6 IMPLANT
SUT VLOC 90 P-14 23 (SUTURE) ×6 IMPLANT
SYR 20ML LL LF (SYRINGE) ×3 IMPLANT
SYR BULB IRRIG 60ML STRL (SYRINGE) ×6 IMPLANT
SYR CONTROL 10ML LL (SYRINGE) ×6 IMPLANT
TAPE MEASURE VINYL STERILE (MISCELLANEOUS) ×3 IMPLANT
TOWEL GREEN STERILE FF (TOWEL DISPOSABLE) ×9 IMPLANT
TRAY DSU PREP LF (CUSTOM PROCEDURE TRAY) ×3 IMPLANT
TRAY FOL W/BAG SLVR 16FR STRL (SET/KITS/TRAYS/PACK) IMPLANT
TRAY FOLEY W/BAG SLVR 14FR LF (SET/KITS/TRAYS/PACK) ×2 IMPLANT
TRAY FOLEY W/BAG SLVR 16FR LF (SET/KITS/TRAYS/PACK)
TUBE CONNECTING 20X1/4 (TUBING) ×3 IMPLANT
UNDERPAD 30X36 HEAVY ABSORB (UNDERPADS AND DIAPERS) ×6 IMPLANT
YANKAUER SUCT BULB TIP NO VENT (SUCTIONS) ×3 IMPLANT

## 2020-12-25 NOTE — Anesthesia Procedure Notes (Signed)
Procedure Name: Intubation Date/Time: 12/25/2020 7:50 AM Performed by: Signe Colt, CRNA Pre-anesthesia Checklist: Patient identified, Emergency Drugs available, Suction available and Patient being monitored Patient Re-evaluated:Patient Re-evaluated prior to induction Oxygen Delivery Method: Circle system utilized Preoxygenation: Pre-oxygenation with 100% oxygen Induction Type: IV induction Ventilation: Mask ventilation without difficulty Laryngoscope Size: Mac and 3 Grade View: Grade I Tube type: Oral Tube size: 7.0 mm Number of attempts: 1 Airway Equipment and Method: Stylet and Oral airway Placement Confirmation: ETT inserted through vocal cords under direct vision,  positive ETCO2 and breath sounds checked- equal and bilateral Secured at: 21 cm Tube secured with: Tape Dental Injury: Teeth and Oropharynx as per pre-operative assessment

## 2020-12-25 NOTE — H&P (Signed)
  H&P faxed to surgical center.  -History and Physical Reviewed  -Patient has been re-examined  -No change in the plan of care  [name] A    

## 2020-12-25 NOTE — Op Note (Signed)
OPERATIVE REPORT  12/25/2020  Alicia Lee  PREOPERATIVE DIAGNOSIS:  Bilateral macromastia.  POSTOPERATIVE DIAGNOSIS:  Bilateral macromastia.  PROCEDURE:  Bilateral Reduction Mammoplasties with liposuction of the Bilateral Breasts/Chest  ATTENDING SURGEON:  Youlanda Roys, MD  ASSISTANT: Rosalene Billings, RNFA  ANESTHESIA:  General.  ANESTHESIOLOGISTCherylynn Ridges, MD  COMPLICATIONS:  None.  INDICATIONS FOR THE PROCEDURE:  The patient is a 60 y.o. female who has bilateral macromastia that is clinically symptomatic.  She presents to undergo bilateral reduction mammoplasties.  DESCRIPTION OF PROCEDURE:  The patient was marked in preop holding area in a pattern of Wise for the future bilateral reduction mammoplasties. She was then taken back to the OR, placed on the table in supine position.  After adequate general anesthesia was obtained, the patient's chest was prepped with Techni-Care and draped in sterile fashion.  The bases of the breasts have been infiltrated with 1% lidocaine with epinephrine.  After adequate hemostasis and anesthesia taken effect, the procedure was begun.  Both of the breast reductions were performed in the following similar manner.  The nipple-areolar complex was marked with a 45-mm nipple marker.  The skin was then incised and deepithelialized around the nipple-areolar complex down to the inframammary crease in the inferior pedicle pattern.  Next, the medial, superior, and lateral skin flaps were elevated down to the chest wall.  Excess fat and glandular tissue removed from the inferior pedicle.  The nipple-areolar complex was examined and found to be pink and viable. The patient had some breast tissue laterally that extended into the axillary area and I performed Power-assisted liposuction of this area bilaterally to avoid a longer excisional scar laterally. Tumescent fluid (300 cc each side) was infiltrated into the lateral breast/chest and  axillary areas, allowed to work properly then liposuction of the breast tissue in this area was performed in the amounts as noted on the accompanying nursing notes. The wound was irrigated with saline irrigation.  Meticulous hemostasis was obtained with the Bovie electrocautery.  Inferior pedicle was centralized using 3-0 Prolene suture.  A #10 JP flat fully fluted drain was placed into the wound. The skin flaps were brought together at the inverted T junction with a 2- 0 Prolene suture.  The incisions were stapled for temporary closure. The breasts were compared and found to have good shape and symmetry.  The incisions were then closed from the medial aspect of the JP drain to the medial aspect of the [facility] incision by first placing a few 3-0 Monocryl sutures to tack together the dermal layer, and then both the dermal and cuticular layer were closed in a single layer using a 3-0 V-Lock  barbed suture.  Lateral to the JP drain incision was closed using 3-0 Monocryl in the dermal layer, followed by 3-0 Monocryl running intracuticular stitch on the skin.  The vertical limb of the Wise pattern was closed in the dermal layer using 3-0 Monocryl suture.  The patient was placed in the upright position.  The future location of the nipple-areolar complexes was marked on both breast mounds using the 42-mm nipple marker.  She was then placed back in the recumbent position.  Both of the nipple areolar complexes were brought out onto the breast mounds in the following similar manner.  The skin was incised as marked and removed in full thickness into the subcutaneous tissues.  The nipple- areolar complex was examined, found to be pink and viable, then brought out through this aperture and sewn in place using 4-0  Monocryl in the dermal layer, followed by 5-0 Monocryl running intracuticular stitch on the skin.  This 5-0 Monocryl suture was then brought down to close the cuticular layer of the vertical limb  as well.  The JP drain was sewn in place using 3-0 nylon suture.  The pectoralis major muscle and fascia along with the breast and chest soft tissues were then infiltrated with 1% Exparel (total 266 mg).  Now the [facility] incision was also infiltrated with the Exparel in order to give the patient postoperative pain control.  The incisions were dressed with benzoin, Steri-Strips, and the nipples dressed with bacitracin ointment and Adaptic.  4x4s were placed over the incisions and ABD pads in the axillary areas.  The patient was placed into a light postoperative support bra.  There were no complications. The patient tolerated the procedure well.  The final needle, sponge counts were reported to be correct at the end of the case.  The patient was then recovered without complications.  Both the patient and her family were given proper postoperative wound care instructions. She was then discharged home in the care of her family in stable condition.  Follow up will be with me in a few days in the office.         Youlanda Roys, M.D.  12/25/2020 12:36 PM

## 2020-12-25 NOTE — Brief Op Note (Signed)
12/25/2020  12:19 PM  PATIENT:  Alicia Lee  60 y.o. female  PRE-OPERATIVE DIAGNOSIS:  BILATERAL MACROMASTIA  POST-OPERATIVE DIAGNOSIS:  BILATERAL MACROMASTIA  PROCEDURE:  Procedure(s): BILATERAL MAMMARY REDUCTION  (BREAST) with Liposuction (Bilateral)of lateral Breast/Chest  SURGEON:  Surgeon(s) and Role:    * Contogiannis, Audrea Muscat, MD - Primary  ASSISTANTS: Rosalene Billings, RNFA  ANESTHESIA:   general  EBL:  100 cc  BLOOD ADMINISTERED:none  DRAINS: (66F) Jackson-Pratt drain(s) with closed bulb suction in the Bilateral Breasts   LOCAL MEDICATIONS USED:  1.3% Exparel (total 226 mgs.)  SPECIMEN:  Source of Specimen:  Bilateral Breasts  DISPOSITION OF SPECIMEN:  PATHOLOGY  COUNTS:  YES  DICTATION: .Note written in EPIC  PLAN OF CARE: Discharge to home after PACU  PATIENT DISPOSITION:  PACU - hemodynamically stable.   Delay start of Pharmacological VTE agent (>24hrs) due to surgical blood loss or risk of bleeding: not applicable

## 2020-12-25 NOTE — Transfer of Care (Signed)
Immediate Anesthesia Transfer of Care Note  Patient: Alicia Lee  Procedure(s) Performed: BILATERAL MAMMARY REDUCTION  (BREAST) with Liposuction (Bilateral Breast)  Patient Location: PACU  Anesthesia Type:General  Level of Consciousness: awake, alert , oriented and patient cooperative  Airway & Oxygen Therapy: Patient Spontanous Breathing and Patient connected to face mask oxygen  Post-op Assessment: Report given to RN and Post -op Vital signs reviewed and stable  Post vital signs: Reviewed and stable  Last Vitals:  Vitals Value Taken Time  BP    Temp    Pulse 97 12/25/20 1231  Resp 18 12/25/20 1231  SpO2 99 % 12/25/20 1231  Vitals shown include unvalidated device data.  Last Pain:  Vitals:   12/25/20 0659  TempSrc: Oral  PainSc: 0-No pain         Complications: No complications documented.

## 2020-12-25 NOTE — Discharge Instructions (Signed)
1. No lifting greater than 5 lbs with arms for 4 weeks. 2. Empty, strip, record and reactivate JP drains 3 times a day. 3. Percocet 5/325 mg tabs 1-2 tabs po q 4-6 hours prn pain- prescription given in office. 4. Duricef 1 tab po bid- prescription given in office. 5. Sterapred dose pack as directed- prescription given in office. 6. Follow-up appointment Friday in office.       JP Drain [employer] this sheet to all of your post-operative appointments while you have your drains.  Please measure your drains by CC's or ML's.  Make sure you drain and measure your JP Drains 2 or 3 times per day.  At the end of each day, add up totals for the left side and add up totals for the right side.    ( 9 am )     ( 3 pm )        ( 9 pm )                Date L  R  L  R  L  R  Total L/R                                                                                                                                                                                          Post Anesthesia Home Care Instructions  Activity: Get plenty of rest for the remainder of the day. A responsible individual must stay with you for 24 hours following the procedure.  For the next 24 hours, DO NOT: -Drive a car -Paediatric nurse -Drink alcoholic beverages -Take any medication unless instructed by your physician -Make any legal decisions or sign important papers.  Meals: Start with liquid foods such as gelatin or soup. Progress to regular foods as tolerated. Avoid greasy, spicy, heavy foods. If nausea and/or vomiting occur, drink only clear liquids until the nausea and/or vomiting subsides. Call your physician if vomiting continues.  Special Instructions/Symptoms: Your throat may feel dry or sore from the anesthesia or the breathing tube placed in your throat during surgery. If this causes discomfort, gargle with warm salt water. The discomfort should disappear within 24 hours.  If you had a  scopolamine patch placed behind your ear for the management of post- operative nausea and/or vomiting:  1. The medication in the patch is effective for 72 hours, after which it should be removed.  Wrap patch in a tissue and discard in the trash. Wash hands thoroughly with soap and water. 2. You may remove the patch earlier than 72 hours if you experience unpleasant side effects which may include dry  mouth, dizziness or visual disturbances. 3. Avoid touching the patch. Wash your hands with soap and water after contact with the patch.    Information for Discharge Teaching: EXPAREL (bupivacaine liposome injectable suspension)   Your surgeon or anesthesiologist gave you EXPAREL(bupivacaine) to help control your pain after surgery.   EXPAREL is a local anesthetic that provides pain relief by numbing the tissue around the surgical site.  EXPAREL is designed to release pain medication over time and can control pain for up to 72 hours.  Depending on how you respond to EXPAREL, you may require less pain medication during your recovery.  Possible side effects:  Temporary loss of sensation or ability to move in the area where bupivacaine was injected.  Nausea, vomiting, constipation  Rarely, numbness and tingling in your mouth or lips, lightheadedness, or anxiety may occur.  Call your doctor right away if you think you may be experiencing any of these sensations, or if you have other questions regarding possible side effects.  Follow all other discharge instructions given to you by your surgeon or nurse. Eat a healthy diet and drink plenty of water or other fluids.  If you return to the hospital for any reason within 96 hours following the administration of EXPAREL, it is important for health care providers to know that you have received this anesthetic. A teal colored band has been placed on your arm with the date, time and amount of EXPAREL you have received in order to alert and inform your  health care providers. Please leave this armband in place for the full 96 hours following administration, and then you may remove the band.

## 2020-12-25 NOTE — Anesthesia Postprocedure Evaluation (Signed)
Anesthesia Post Note  Patient: Alicia Lee  Procedure(s) Performed: BILATERAL MAMMARY REDUCTION  (BREAST) with Liposuction (Bilateral Breast)     Patient location during evaluation: PACU Anesthesia Type: General Level of consciousness: awake and alert Pain management: pain level controlled Vital Signs Assessment: post-procedure vital signs reviewed and stable Respiratory status: spontaneous breathing, nonlabored ventilation, respiratory function stable and patient connected to nasal cannula oxygen Cardiovascular status: blood pressure returned to baseline and stable Postop Assessment: no apparent nausea or vomiting Anesthetic complications: no Comments: Pt w L corneal abrasio in PACun?  Protocol followed pt dced to hoome   No complications documented.  Last Vitals:  Vitals:   12/25/20 1245 12/25/20 1300  BP: 139/81 131/69  Pulse: 98 95  Resp: 17 13  Temp:    SpO2: 95% 99%    Last Pain:  Vitals:   12/25/20 1300  TempSrc:   PainSc: 0-No pain                 Barnet Glasgow

## 2020-12-26 NOTE — Addendum Note (Signed)
Addendum  created 12/26/20 1214 by Lavonia Dana, CRNA   Charge Capture section accepted

## 2020-12-28 LAB — SURGICAL PATHOLOGY

## 2021-01-01 ENCOUNTER — Encounter (HOSPITAL_BASED_OUTPATIENT_CLINIC_OR_DEPARTMENT_OTHER): Payer: Self-pay | Admitting: Plastic Surgery

## 2021-08-05 IMAGING — MG DIGITAL SCREENING BILAT W/ TOMO W/ CAD
8 series · 8 of 24 positions shown · non-contrast
Comparison: Previous exam(s).

CLINICAL DATA: Screening.

EXAM:
DIGITAL SCREENING BILATERAL MAMMOGRAM WITH TOMO AND CAD

[L CC synth-2D]
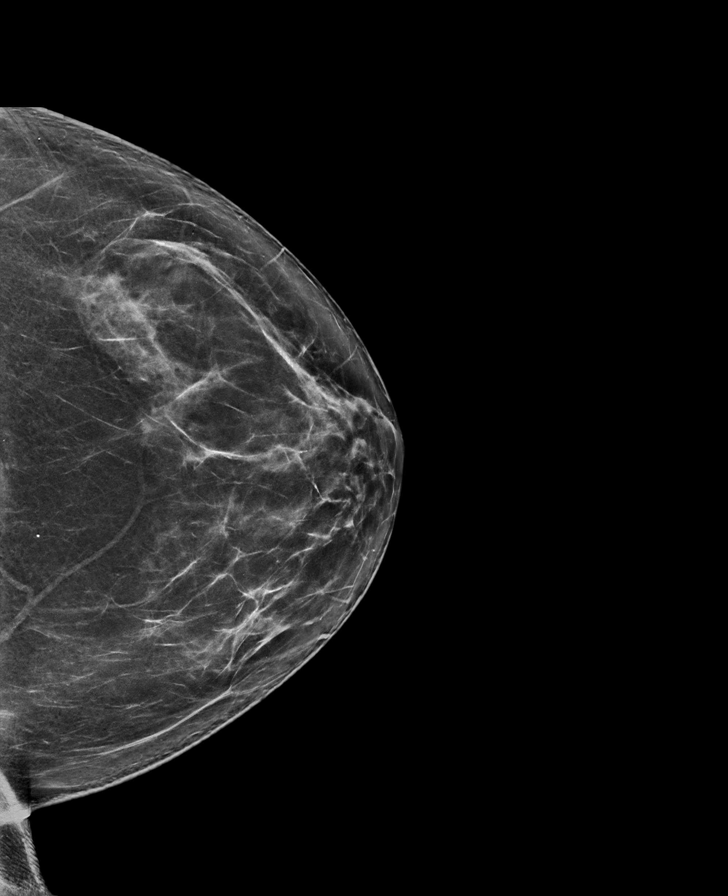

[R MLO synth-2D]
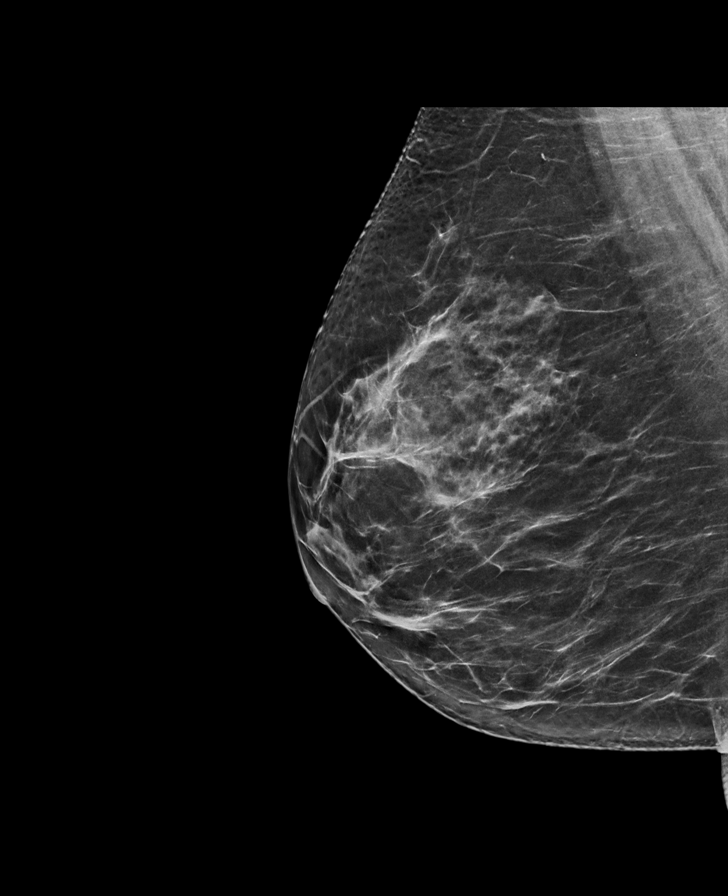

[R CC synth-2D]
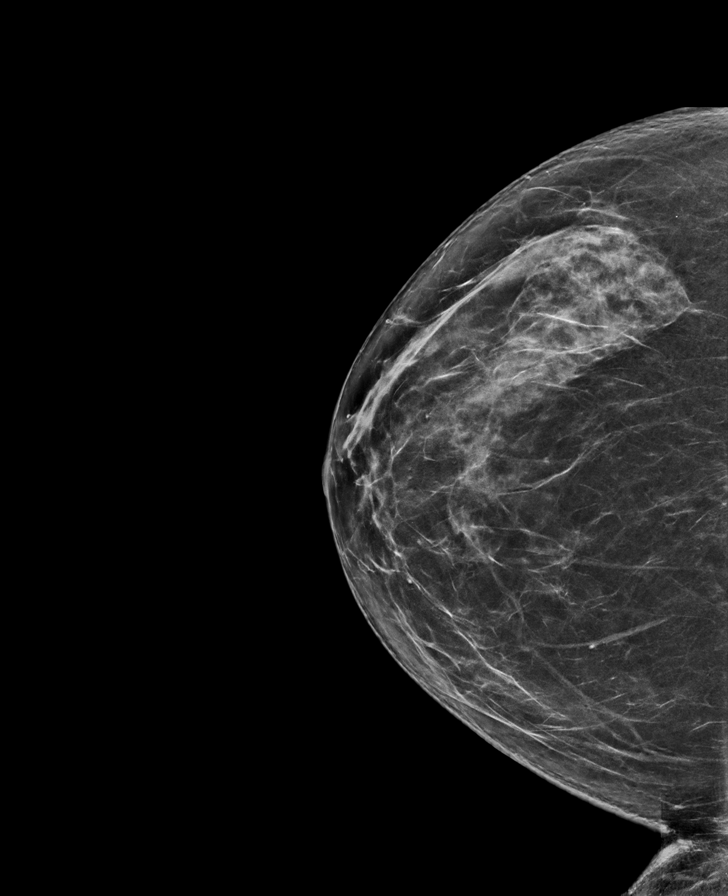

[L MLO synth-2D]
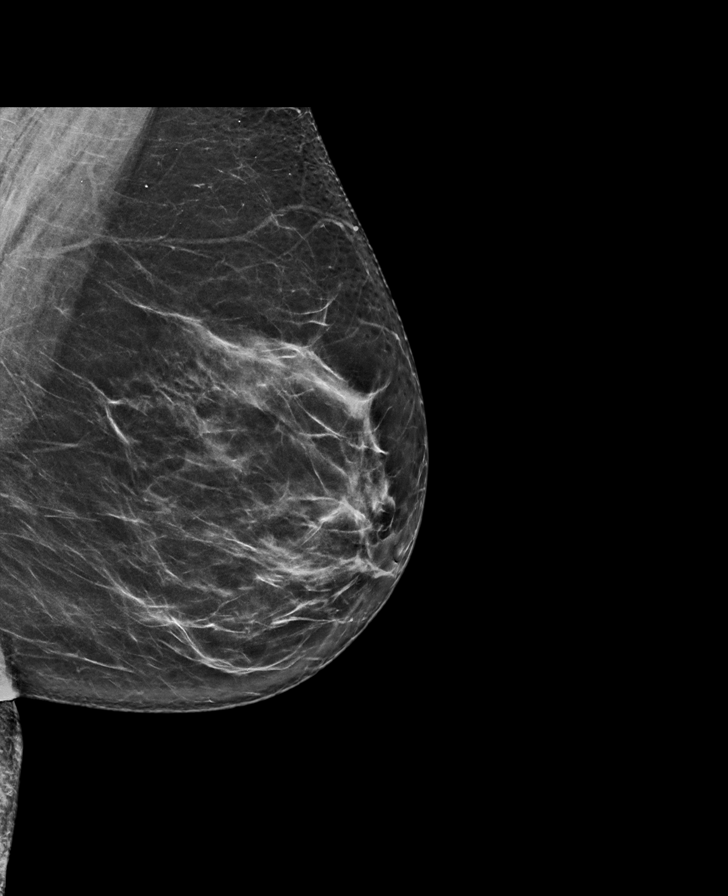

[R MLO tomo · tomo slice 40/79.0]
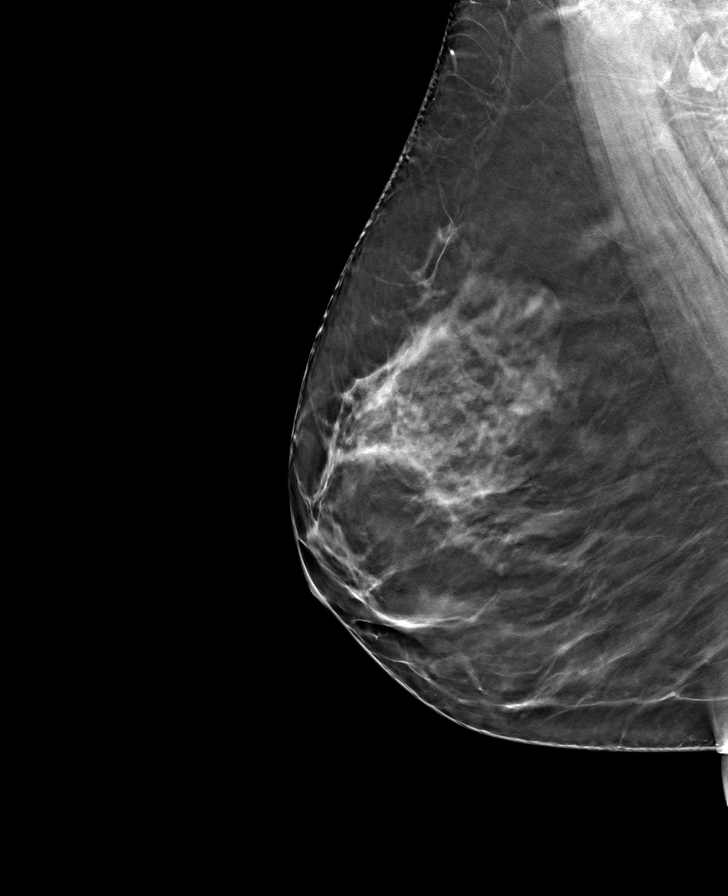

[L MLO tomo · tomo slice 41/80.0]
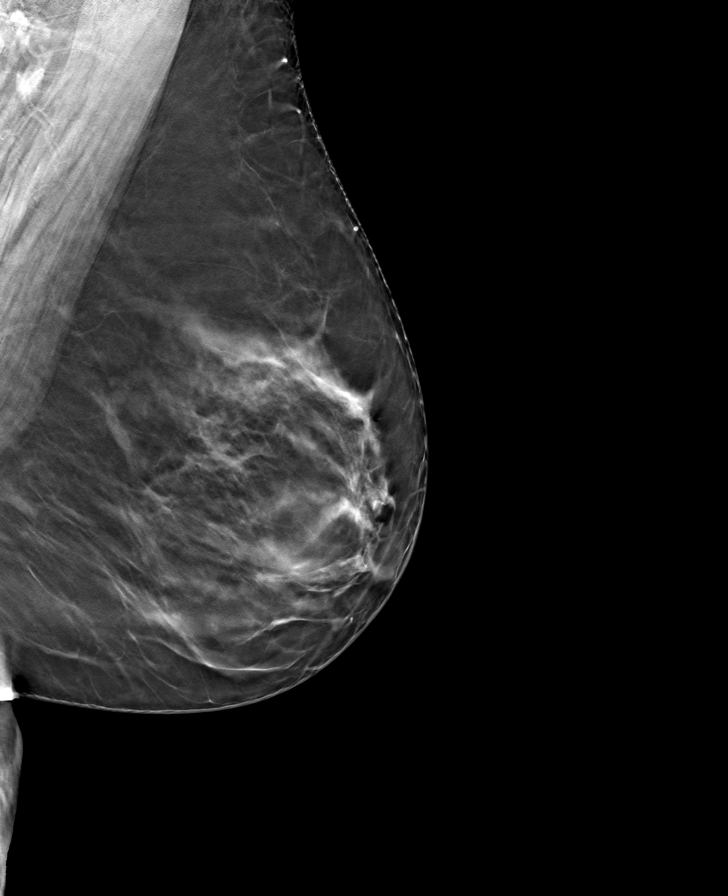

[R CC tomo · tomo slice 39/76.0]
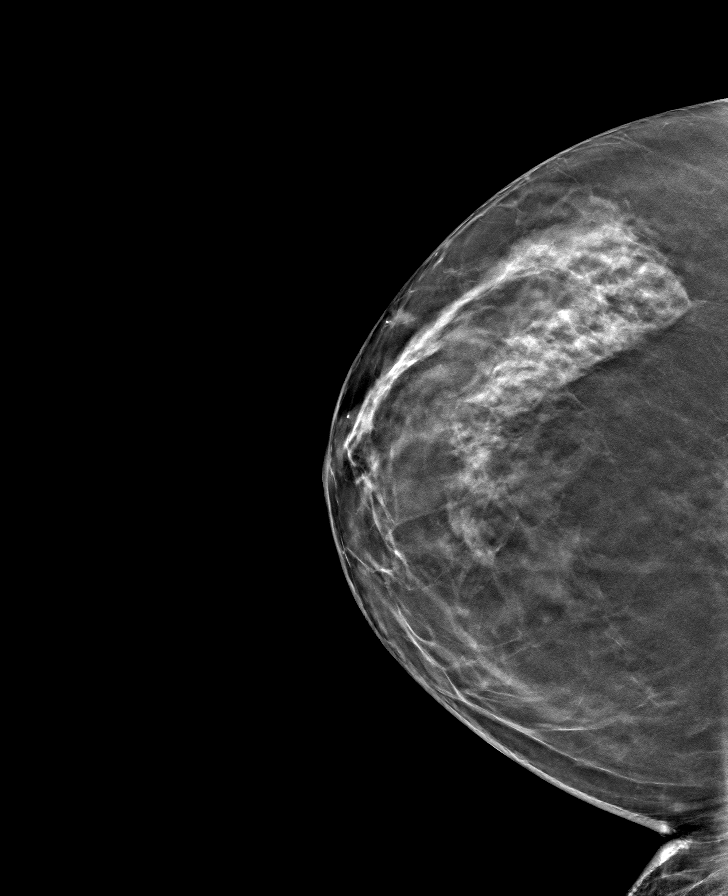

[L CC tomo · tomo slice 41/82.0]
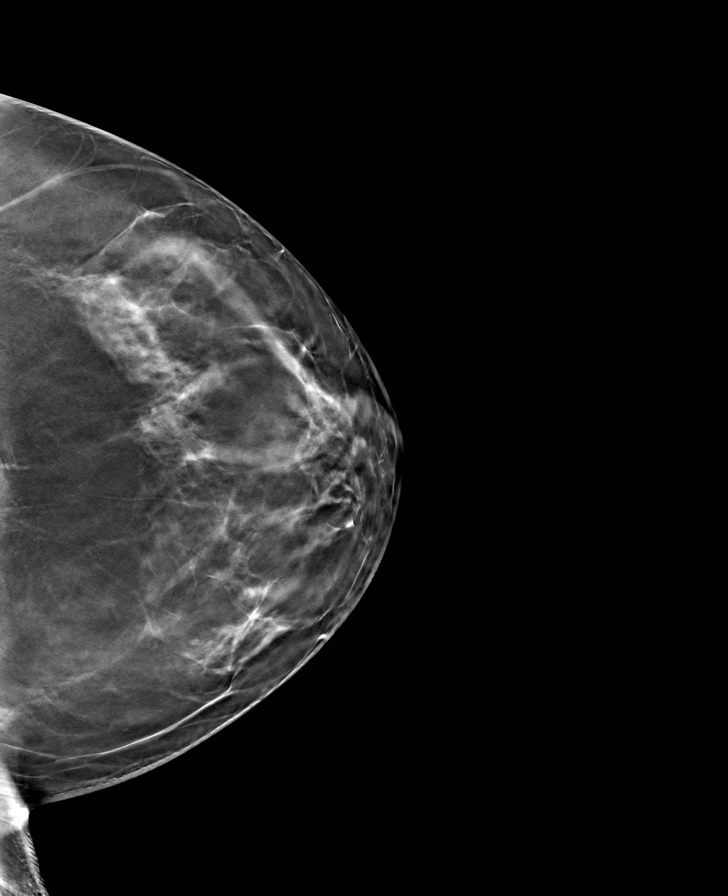

[8 of 24 positions shown; findings below may reference images not displayed]

ACR Breast Density Category b: There are scattered areas of
fibroglandular density.
FINDINGS: There are no findings suspicious for malignancy. Images were
processed with CAD.
IMPRESSION: No mammographic evidence of malignancy. A result letter of this
screening mammogram will be mailed directly to the patient.

RECOMMENDATION:
Screening mammogram in one year. (Code:[id number])

BI-RADS CATEGORY  1: Negative.

## 2021-12-04 ENCOUNTER — Other Ambulatory Visit: Payer: Self-pay

## 2021-12-04 ENCOUNTER — Encounter (HOSPITAL_BASED_OUTPATIENT_CLINIC_OR_DEPARTMENT_OTHER): Payer: Self-pay | Admitting: Plastic Surgery

## 2021-12-12 ENCOUNTER — Ambulatory Visit: Payer: Self-pay | Admitting: Plastic Surgery

## 2021-12-13 ENCOUNTER — Ambulatory Visit (HOSPITAL_BASED_OUTPATIENT_CLINIC_OR_DEPARTMENT_OTHER): Payer: 59 | Admitting: Certified Registered"

## 2021-12-13 ENCOUNTER — Ambulatory Visit (HOSPITAL_BASED_OUTPATIENT_CLINIC_OR_DEPARTMENT_OTHER)
Admission: RE | Admit: 2021-12-13 | Discharge: 2021-12-13 | Disposition: A | Discharge status: Home / Self Care | Payer: 59 | Attending: Plastic Surgery | Admitting: Plastic Surgery

## 2021-12-13 ENCOUNTER — Encounter (HOSPITAL_BASED_OUTPATIENT_CLINIC_OR_DEPARTMENT_OTHER): Payer: Self-pay | Admitting: Plastic Surgery

## 2021-12-13 ENCOUNTER — Other Ambulatory Visit: Payer: Self-pay

## 2021-12-13 ENCOUNTER — Encounter (HOSPITAL_BASED_OUTPATIENT_CLINIC_OR_DEPARTMENT_OTHER)
Admission: RE | Disposition: A | Discharge status: Home / Self Care | Payer: Self-pay | Source: Home / Self Care | Attending: Plastic Surgery

## 2021-12-13 DIAGNOSIS — Z421 Encounter for breast reconstruction following mastectomy: Secondary | ICD-10-CM | POA: Diagnosis not present

## 2021-12-13 DIAGNOSIS — N651 Disproportion of reconstructed breast: Secondary | ICD-10-CM | POA: Diagnosis not present

## 2021-12-13 DIAGNOSIS — F32A Depression, unspecified: Secondary | ICD-10-CM | POA: Diagnosis not present

## 2021-12-13 DIAGNOSIS — T8189XA Other complications of procedures, not elsewhere classified, initial encounter: Secondary | ICD-10-CM | POA: Insufficient documentation

## 2021-12-13 DIAGNOSIS — X58XXXA Exposure to other specified factors, initial encounter: Secondary | ICD-10-CM | POA: Insufficient documentation

## 2021-12-13 DIAGNOSIS — N6489 Other specified disorders of breast: Secondary | ICD-10-CM | POA: Diagnosis present

## 2021-12-13 DIAGNOSIS — Q831 Accessory breast: Secondary | ICD-10-CM | POA: Diagnosis not present

## 2021-12-13 DIAGNOSIS — I10 Essential (primary) hypertension: Secondary | ICD-10-CM | POA: Insufficient documentation

## 2021-12-13 HISTORY — PX: LIPOSUCTION: SHX10

## 2021-12-13 SURGERY — REVISION, RECONSTRUCTION, BREAST
Anesthesia: General | Site: Breast | Laterality: Right

## 2021-12-13 SURGERY — LIPOSUCTION
Anesthesia: General | Laterality: Right

## 2021-12-13 MED ORDER — FENTANYL CITRATE (PF) 100 MCG/2ML IJ SOLN
INTRAMUSCULAR | Status: DC | PRN
  Administered 2021-12-13: 50 ug via INTRAVENOUS
  Administered 2021-12-13 (×3): 25 ug via INTRAVENOUS
  Administered 2021-12-13: 50 ug via INTRAVENOUS
  Administered 2021-12-13: 25 ug via INTRAVENOUS

## 2021-12-13 MED ORDER — MIDAZOLAM HCL 5 MG/5ML IJ SOLN
INTRAMUSCULAR | Status: DC | PRN
Start: 2021-12-13 — End: 2021-12-13
  Administered 2021-12-13: 2 mg via INTRAVENOUS

## 2021-12-13 MED ORDER — EPINEPHRINE PF 1 MG/ML IJ SOLN
INTRAMUSCULAR | Status: AC
Start: 2021-12-13 — End: ?
  Filled 2021-12-13: qty 1

## 2021-12-13 MED ORDER — CEFAZOLIN SODIUM-DEXTROSE 2-4 GM/100ML-% IV SOLN
INTRAVENOUS | Status: AC
  Filled 2021-12-13: qty 100

## 2021-12-13 MED ORDER — LIDOCAINE HCL (PF) 1 % IJ SOLN
INTRAMUSCULAR | Status: AC
  Filled 2021-12-13: qty 30

## 2021-12-13 MED ORDER — BUPIVACAINE-EPINEPHRINE (PF) 0.5% -1:200000 IJ SOLN
INTRAMUSCULAR | Status: DC | PRN
  Administered 2021-12-13: 30 mL via PERINEURAL

## 2021-12-13 MED ORDER — FENTANYL CITRATE (PF) 100 MCG/2ML IJ SOLN
INTRAMUSCULAR | Status: AC
  Filled 2021-12-13: qty 2

## 2021-12-13 MED ORDER — LIDOCAINE HCL 1 % IJ SOLN
INTRAVENOUS | Status: DC | PRN
  Administered 2021-12-13: 100 mL

## 2021-12-13 MED ORDER — FENTANYL CITRATE (PF) 100 MCG/2ML IJ SOLN: 25.0000 ug | INTRAMUSCULAR | Status: DC | PRN

## 2021-12-13 MED ORDER — LIDOCAINE-EPINEPHRINE 1 %-1:100000 IJ SOLN
INTRAMUSCULAR | Status: AC
Start: 2021-12-13 — End: ?
  Filled 2021-12-13: qty 1

## 2021-12-13 MED ORDER — OXYCODONE HCL 5 MG PO TABS
ORAL_TABLET | ORAL | Status: AC
  Filled 2021-12-13: qty 1

## 2021-12-13 MED ORDER — ACETAMINOPHEN 500 MG PO TABS
ORAL_TABLET | ORAL | Status: AC
  Filled 2021-12-13: qty 2

## 2021-12-13 MED ORDER — OXYCODONE HCL 5 MG PO TABS
5.0000 mg | ORAL_TABLET | Freq: Once | ORAL | Status: AC | PRN
  Administered 2021-12-13: 5 mg via ORAL

## 2021-12-13 MED ORDER — LIDOCAINE 2% (20 MG/ML) 5 ML SYRINGE
INTRAMUSCULAR | Status: AC
Start: 2021-12-13 — End: ?
  Filled 2021-12-13: qty 5

## 2021-12-13 MED ORDER — BUPIVACAINE-EPINEPHRINE (PF) 0.5% -1:200000 IJ SOLN
INTRAMUSCULAR | Status: AC
  Filled 2021-12-13: qty 30

## 2021-12-13 MED ORDER — PROPOFOL 10 MG/ML IV BOLUS
INTRAVENOUS | Status: DC | PRN
Start: 2021-12-13 — End: 2021-12-13
  Administered 2021-12-13: 20 mg via INTRAVENOUS
  Administered 2021-12-13 (×2): 50 mg via INTRAVENOUS
  Administered 2021-12-13: 200 mg via INTRAVENOUS

## 2021-12-13 MED ORDER — EPHEDRINE SULFATE (PRESSORS) 50 MG/ML IJ SOLN
INTRAMUSCULAR | Status: DC | PRN
  Administered 2021-12-13 (×2): 10 mg via INTRAVENOUS

## 2021-12-13 MED ORDER — CHLORHEXIDINE GLUCONATE CLOTH 2 % EX PADS: 6.0000 | MEDICATED_PAD | Freq: Once | CUTANEOUS | Status: DC

## 2021-12-13 MED ORDER — MIDAZOLAM HCL 2 MG/2ML IJ SOLN
INTRAMUSCULAR | Status: AC
  Filled 2021-12-13: qty 2

## 2021-12-13 MED ORDER — FENTANYL CITRATE (PF) 100 MCG/2ML IJ SOLN
INTRAMUSCULAR | Status: AC
Start: 2021-12-13 — End: ?
  Filled 2021-12-13: qty 2

## 2021-12-13 MED ORDER — OXYCODONE HCL 5 MG/5ML PO SOLN: 5.0000 mg | Freq: Once | ORAL | Status: AC | PRN

## 2021-12-13 MED ORDER — LACTATED RINGERS IV SOLN: INTRAVENOUS | Status: DC

## 2021-12-13 MED ORDER — PROMETHAZINE HCL 25 MG/ML IJ SOLN: 6.2500 mg | INTRAMUSCULAR | Status: DC | PRN

## 2021-12-13 MED ORDER — KETOROLAC TROMETHAMINE 30 MG/ML IJ SOLN: 30.0000 mg | Freq: Once | INTRAMUSCULAR | Status: DC | PRN

## 2021-12-13 MED ORDER — DEXAMETHASONE SODIUM PHOSPHATE 10 MG/ML IJ SOLN
INTRAMUSCULAR | Status: AC
  Filled 2021-12-13: qty 1

## 2021-12-13 MED ORDER — LIDOCAINE-EPINEPHRINE 1 %-1:100000 IJ SOLN
INTRAMUSCULAR | Status: DC | PRN
  Administered 2021-12-13: 10 mL

## 2021-12-13 MED ORDER — DEXAMETHASONE SODIUM PHOSPHATE 4 MG/ML IJ SOLN
INTRAMUSCULAR | Status: DC | PRN
Start: 2021-12-13 — End: 2021-12-13
  Administered 2021-12-13: 10 mg via INTRAVENOUS

## 2021-12-13 MED ORDER — ACETAMINOPHEN 500 MG PO TABS
1000.0000 mg | ORAL_TABLET | Freq: Once | ORAL | Status: AC
  Administered 2021-12-13: 1000 mg via ORAL

## 2021-12-13 MED ORDER — PROPOFOL 500 MG/50ML IV EMUL
INTRAVENOUS | Status: DC | PRN
  Administered 2021-12-13: 25 ug/kg/min via INTRAVENOUS

## 2021-12-13 MED ORDER — AMISULPRIDE (ANTIEMETIC) 5 MG/2ML IV SOLN: 10.0000 mg | Freq: Once | INTRAVENOUS | Status: DC | PRN

## 2021-12-13 MED ORDER — CEFAZOLIN SODIUM-DEXTROSE 2-4 GM/100ML-% IV SOLN
2.0000 g | INTRAVENOUS | Status: AC
  Administered 2021-12-13: 2 g via INTRAVENOUS

## 2021-12-13 MED ORDER — LIDOCAINE 2% (20 MG/ML) 5 ML SYRINGE
INTRAMUSCULAR | Status: DC | PRN
Start: 2021-12-13 — End: 2021-12-13
  Administered 2021-12-13: 60 mg via INTRAVENOUS

## 2021-12-13 MED ORDER — ONDANSETRON HCL 4 MG/2ML IJ SOLN
INTRAMUSCULAR | Status: AC
  Filled 2021-12-13: qty 2

## 2021-12-13 MED ORDER — ONDANSETRON HCL 4 MG/2ML IJ SOLN
INTRAMUSCULAR | Status: DC | PRN
  Administered 2021-12-13: 4 mg via INTRAVENOUS

## 2021-12-13 MED ORDER — PROPOFOL 10 MG/ML IV BOLUS
INTRAVENOUS | Status: AC
  Filled 2021-12-13: qty 20

## 2021-12-13 SURGICAL SUPPLY — 64 items
APL SKNCLS STERI-STRIP NONHPOA (GAUZE/BANDAGES/DRESSINGS) ×1
BAG DECANTER FOR FLEXI CONT (MISCELLANEOUS) ×2 IMPLANT
BENZOIN TINCTURE PRP APPL 2/3 (GAUZE/BANDAGES/DRESSINGS) ×2 IMPLANT
BLADE HEX COATED 2.75 (ELECTRODE) ×2 IMPLANT
BLADE KNIFE PERSONA 10 (BLADE) ×8 IMPLANT
BLADE KNIFE PERSONA 15 (BLADE) ×6 IMPLANT
BNDG GAUZE ELAST 4 BULKY (GAUZE/BANDAGES/DRESSINGS) ×4 IMPLANT
CANISTER SUCT 1200ML W/VALVE (MISCELLANEOUS) ×2 IMPLANT
CAP BOUFFANT 24 BLUE NURSES (PROTECTIVE WEAR) ×2 IMPLANT
COVER BACK TABLE 60X90IN (DRAPES) ×2 IMPLANT
COVER MAYO STAND STRL (DRAPES) ×2 IMPLANT
DRAIN CHANNEL 10F 3/8 F FF (DRAIN) ×4 IMPLANT
DRAPE LAPAROSCOPIC ABDOMINAL (DRAPES) ×2 IMPLANT
DRSG EMULSION OIL 3X3 NADH (GAUZE/BANDAGES/DRESSINGS) ×4 IMPLANT
DRSG PAD ABDOMINAL 8X10 ST (GAUZE/BANDAGES/DRESSINGS) ×4 IMPLANT
ELECT BLADE 4.0 EZ CLEAN MEGAD (MISCELLANEOUS) ×2
ELECT REM PT RETURN 9FT ADLT (ELECTROSURGICAL) ×2
ELECTRODE BLDE 4.0 EZ CLN MEGD (MISCELLANEOUS) ×1 IMPLANT
ELECTRODE REM PT RTRN 9FT ADLT (ELECTROSURGICAL) ×1 IMPLANT
EVACUATOR SILICONE 100CC (DRAIN) ×4 IMPLANT
GAUZE SPONGE 4X4 12PLY STRL (GAUZE/BANDAGES/DRESSINGS) ×4 IMPLANT
GLOVE SURG ENC MOIS LTX SZ6.5 (GLOVE) ×4 IMPLANT
GLOVE SURG ENC MOIS LTX SZ7 (GLOVE) ×2 IMPLANT
GLOVE SURG UNDER POLY LF SZ6.5 (GLOVE) IMPLANT
GOWN STRL REUS W/ TWL LRG LVL3 (GOWN DISPOSABLE) ×2 IMPLANT
GOWN STRL REUS W/ TWL XL LVL3 (GOWN DISPOSABLE) IMPLANT
GOWN STRL REUS W/TWL LRG LVL3 (GOWN DISPOSABLE) ×4
GOWN STRL REUS W/TWL XL LVL3 (GOWN DISPOSABLE)
LINER CANISTER 1000CC FLEX (MISCELLANEOUS) ×4 IMPLANT
MARKER SKIN DUAL TIP RULER LAB (MISCELLANEOUS) ×2 IMPLANT
NDL HYPO 25X1 1.5 SAFETY (NEEDLE) ×3 IMPLANT
NDL SAFETY ECLIPSE 18X1.5 (NEEDLE) ×1 IMPLANT
NDL SPNL 18GX3.5 QUINCKE PK (NEEDLE) ×1 IMPLANT
NEEDLE HYPO 18GX1.5 SHARP (NEEDLE) ×2
NEEDLE HYPO 25X1 1.5 SAFETY (NEEDLE) ×6 IMPLANT
NEEDLE SPNL 18GX3.5 QUINCKE PK (NEEDLE) ×2 IMPLANT
NS IRRIG 1000ML POUR BTL (IV SOLUTION) ×4 IMPLANT
PACK BASIN DAY SURGERY FS (CUSTOM PROCEDURE TRAY) ×2 IMPLANT
PACK UNIVERSAL I (CUSTOM PROCEDURE TRAY) ×2 IMPLANT
PAD FOAM SILICONE BACKED (GAUZE/BANDAGES/DRESSINGS) ×2 IMPLANT
PENCIL SMOKE EVACUATOR (MISCELLANEOUS) ×2 IMPLANT
PIN SAFETY STERILE (MISCELLANEOUS) ×2 IMPLANT
SCRUB TECHNI CARE 4 OZ NO DYE (MISCELLANEOUS) ×2 IMPLANT
SCRUB TECHNI CARE SURGICAL (MISCELLANEOUS) IMPLANT
SHEET MEDIUM DRAPE 40X70 STRL (DRAPES) ×4 IMPLANT
SLEEVE SCD COMPRESS KNEE MED (STOCKING) ×2 IMPLANT
SPIKE FLUID TRANSFER (MISCELLANEOUS) ×4 IMPLANT
SPONGE T-LAP 18X18 ~~LOC~~+RFID (SPONGE) ×6 IMPLANT
STAPLER VISISTAT 35W (STAPLE) ×4 IMPLANT
STRIP CLOSURE SKIN 1/2X4 (GAUZE/BANDAGES/DRESSINGS) ×8 IMPLANT
SUT MNCRL AB 3-0 PS2 18 (SUTURE) ×7 IMPLANT
SUT MNCRL AB 4-0 PS2 18 (SUTURE) ×4 IMPLANT
SYR 20ML LL LF (SYRINGE) ×2 IMPLANT
SYR 50ML LL SCALE MARK (SYRINGE) ×1 IMPLANT
SYR BULB IRRIG 60ML STRL (SYRINGE) ×4 IMPLANT
SYR CONTROL 10ML LL (SYRINGE) ×4 IMPLANT
TAPE MEASURE VINYL STERILE (MISCELLANEOUS) ×2 IMPLANT
TOWEL GREEN STERILE FF (TOWEL DISPOSABLE) ×6 IMPLANT
TRAY DSU PREP LF (CUSTOM PROCEDURE TRAY) ×4 IMPLANT
TUBE CONNECTING 20X1/4 (TUBING) ×2 IMPLANT
TUBING INFILTRATION IT-10001 (TUBING) ×1 IMPLANT
TUBING SET GRADUATE ASPIR 12FT (MISCELLANEOUS) ×2 IMPLANT
UNDERPAD 30X36 HEAVY ABSORB (UNDERPADS AND DIAPERS) ×4 IMPLANT
YANKAUER SUCT BULB TIP NO VENT (SUCTIONS) ×2 IMPLANT

## 2021-12-13 NOTE — H&P (Signed)
  H&P faxed to surgical center.  -History and Physical Reviewed  -Patient has been re-examined  -No change in the plan of care  Whit Bruni A    

## 2021-12-13 NOTE — Anesthesia Preprocedure Evaluation (Addendum)
Anesthesia Evaluation  Patient identified by MRN, date of birth, ID band Patient awake    Reviewed: Allergy & Precautions, NPO status , Patient's Chart, lab work & pertinent test results  Airway Mallampati: II  TM Distance: >3 FB Neck ROM: Full    Dental no notable dental hx.    Pulmonary neg pulmonary ROS,    Pulmonary exam normal breath sounds clear to auscultation       Cardiovascular hypertension, Pt. on medications Normal cardiovascular exam Rhythm:Regular Rate:Normal  ECG: NSR, rate 62   Neuro/Psych PSYCHIATRIC DISORDERS Depression  Neuromuscular disease    GI/Hepatic negative GI ROS, Neg liver ROS,   Endo/Other  negative endocrine ROS  Renal/GU negative Renal ROS     Musculoskeletal negative musculoskeletal ROS (+)   Abdominal   Peds  Hematology negative hematology ROS (+)   Anesthesia Other Findings UNEVEN BREAST DUE TO REDUCTION  Reproductive/Obstetrics                            Anesthesia Physical Anesthesia Plan  ASA: 2  Anesthesia Plan: General   Post-op Pain Management:    Induction: Intravenous  PONV Risk Score and Plan: 3 and Ondansetron, Dexamethasone, Midazolam and Treatment may vary due to age or medical condition  Airway Management Planned: LMA  Additional Equipment:   Intra-op Plan:   Post-operative Plan: Extubation in OR  Informed Consent: I have reviewed the patients History and Physical, chart, labs and discussed the procedure including the risks, benefits and alternatives for the proposed anesthesia with the patient or authorized representative who has indicated his/her understanding and acceptance.     Dental advisory given  Plan Discussed with: CRNA  Anesthesia Plan Comments:         Anesthesia Quick Evaluation

## 2021-12-13 NOTE — Brief Op Note (Signed)
12/13/2021  2:08 PM  PATIENT:  Alicia Lee  61 y.o. female  PRE-OPERATIVE DIAGNOSIS:  BREAST ASYMMETRY S/P BILATERAL REDUCTION MAMMAPLASTIES  POST-OPERATIVE DIAGNOSIS:  BREAST ASYMMETRY S/P BILATERAL REDUCTION MAMMAPLASTIES  PROCEDURE:  LIPOSUCTION OF RIGHT BREAST AND REVISION RECONSTRUCTED RIGHT BREAST  SURGEON:  Surgeon(s) and Role:    * Kalea Perine, Audrea Muscat, MD - Primary  ANESTHESIA:   general  EBL:  10 mL   BLOOD ADMINISTERED:none  DRAINS: none   LOCAL MEDICATIONS USED:  MARCAINE     SPECIMEN:  No Specimen  DISPOSITION OF SPECIMEN:  N/A  COUNTS:  YES  DICTATION: .Other Dictation: Dictation Number [id number]  PLAN OF CARE: Discharge to home after PACU  PATIENT DISPOSITION:  PACU - hemodynamically stable.   Delay start of Pharmacological VTE agent (>24hrs) due to surgical blood loss or risk of bleeding: not applicable

## 2021-12-13 NOTE — Discharge Instructions (Addendum)
°  Post Anesthesia Home Care Instructions  Activity: Get plenty of rest for the remainder of the day. A responsible individual must stay with you for 24 hours following the procedure.  For the next 24 hours, DO NOT: -Drive a car -Paediatric nurse -Drink alcoholic beverages -Take any medication unless instructed by your physician -Make any legal decisions or sign important papers.  Meals: Start with liquid foods such as gelatin or soup. Progress to regular foods as tolerated. Avoid greasy, spicy, heavy foods. If nausea and/or vomiting occur, drink only clear liquids until the nausea and/or vomiting subsides. Call your physician if vomiting continues.  Special Instructions/Symptoms: Your throat may feel dry or sore from the anesthesia or the breathing tube placed in your throat during surgery. If this causes discomfort, gargle with warm salt water. The discomfort should disappear within 24 hours.  If you had a scopolamine patch placed behind your ear for the management of post- operative nausea and/or vomiting:  1. The medication in the patch is effective for 72 hours, after which it should be removed.  Wrap patch in a tissue and discard in the trash. Wash hands thoroughly with soap and water. 2. You may remove the patch earlier than 72 hours if you experience unpleasant side effects which may include dry mouth, dizziness or visual disturbances. 3. Avoid touching the patch. Wash your hands with soap and water after contact with the patch.      Next tylenol 5pm Oxycodone given at 3:00pm   1. No lifting greater than 5 lbs with arms for 4 weeks. 2. Percocet 5/325 mg tabs 1-2 tabs po q 4-6 hours prn pain- prescription given in office. 3. Keflex 500 mg tab po qid- prescription given in office. 4. Sterapred dose pack as directed- prescription given in office. 6. Follow-up appointment Monday in office (already scheduled).

## 2021-12-13 NOTE — Transfer of Care (Signed)
Immediate Anesthesia Transfer of Care Note  Patient: Makiyah Zentz  Procedure(s) Performed: REVISION OF BREAST RECONSTRUCTION (Right: Breast) LIPOSUCTION (Right: Breast)  Patient Location: PACU  Anesthesia Type:Regional  Level of Consciousness: drowsy  Airway & Oxygen Therapy: Patient Spontanous Breathing and Patient connected to face mask oxygen  Post-op Assessment: Report given to RN and Post -op Vital signs reviewed and stable  Post vital signs: Reviewed and stable  Last Vitals:  Vitals Value Taken Time  BP 134/67 12/13/21 1419  Temp 36.3 C 12/13/21 1419  Pulse 80 12/13/21 1419  Resp 10 12/13/21 1420  SpO2 99 % 12/13/21 1419  Vitals shown include unvalidated device data.  Last Pain:  Vitals:   12/13/21 1039  TempSrc: Oral  PainSc: 0-No pain      Patients Stated Pain Goal: 3 (65/99/35 7017)  Complications: No notable events documented.

## 2021-12-13 NOTE — Anesthesia Procedure Notes (Signed)
Procedure Name: LMA Insertion Date/Time: 12/13/2021 10:07 AM Performed by: Maryella Shivers, CRNA Pre-anesthesia Checklist: Patient identified, Emergency Drugs available, Suction available and Patient being monitored Patient Re-evaluated:Patient Re-evaluated prior to induction Oxygen Delivery Method: Circle system utilized Preoxygenation: Pre-oxygenation with 100% oxygen Induction Type: IV induction Ventilation: Mask ventilation without difficulty LMA: LMA inserted LMA Size: 4.0 Number of attempts: 1 Airway Equipment and Method: Bite block Placement Confirmation: positive ETCO2 Tube secured with: Tape Dental Injury: Teeth and Oropharynx as per pre-operative assessment

## 2021-12-14 NOTE — Anesthesia Postprocedure Evaluation (Signed)
Anesthesia Post Note  Patient: Alicia Lee  Procedure(s) Performed: REVISION OF BREAST RECONSTRUCTION (Right: Breast) LIPOSUCTION (Right: Breast)     Patient location during evaluation: PACU Anesthesia Type: General Level of consciousness: awake Pain management: pain level controlled Vital Signs Assessment: post-procedure vital signs reviewed and stable Respiratory status: spontaneous breathing, nonlabored ventilation, respiratory function stable and patient connected to nasal cannula oxygen Cardiovascular status: blood pressure returned to baseline and stable Postop Assessment: no apparent nausea or vomiting Anesthetic complications: no   No notable events documented.  Last Vitals:  Vitals:   12/13/21 1441 12/13/21 1455  BP: 137/70 (!) 141/75  Pulse: 78 81  Resp: 19 18  Temp:  36.4 C  SpO2: 95% 96%    Last Pain:  Vitals:   12/13/21 1441  TempSrc:   PainSc: 3                  [name] P Abbigaile Rockman

## 2021-12-16 ENCOUNTER — Encounter (HOSPITAL_BASED_OUTPATIENT_CLINIC_OR_DEPARTMENT_OTHER): Payer: Self-pay | Admitting: Plastic Surgery

## 2022-06-25 ENCOUNTER — Other Ambulatory Visit (HOSPITAL_COMMUNITY): Payer: Self-pay | Admitting: Family Medicine

## 2022-06-25 DIAGNOSIS — R55 Syncope and collapse: Secondary | ICD-10-CM

## 2022-07-08 ENCOUNTER — Ambulatory Visit (HOSPITAL_COMMUNITY): Payer: 59 | Attending: Family Medicine

## 2022-07-08 DIAGNOSIS — I5030 Unspecified diastolic (congestive) heart failure: Secondary | ICD-10-CM | POA: Diagnosis not present

## 2022-07-08 DIAGNOSIS — R55 Syncope and collapse: Secondary | ICD-10-CM | POA: Diagnosis present

## 2022-07-08 LAB — ECHOCARDIOGRAM COMPLETE
Area-P 1/2: 3.91 cm2
S' Lateral: 2.1 cm

## 2023-07-22 ENCOUNTER — Ambulatory Visit: Payer: 59 | Admitting: Internal Medicine

## 2023-08-04 ENCOUNTER — Encounter: Payer: Self-pay | Admitting: Allergy and Immunology

## 2023-08-04 ENCOUNTER — Other Ambulatory Visit: Payer: Self-pay

## 2023-08-04 ENCOUNTER — Ambulatory Visit: Payer: 59 | Admitting: Allergy and Immunology

## 2023-08-04 VITALS — BP 130/88 | HR 71 | Temp 98.1°F | Resp 14 | Ht 62.99 in | Wt 137.2 lb

## 2023-08-04 DIAGNOSIS — R519 Headache, unspecified: Secondary | ICD-10-CM

## 2023-08-04 DIAGNOSIS — G500 Trigeminal neuralgia: Secondary | ICD-10-CM | POA: Diagnosis not present

## 2023-08-04 DIAGNOSIS — L2089 Other atopic dermatitis: Secondary | ICD-10-CM

## 2023-08-04 DIAGNOSIS — R600 Localized edema: Secondary | ICD-10-CM

## 2023-08-04 DIAGNOSIS — H02831 Dermatochalasis of right upper eyelid: Secondary | ICD-10-CM

## 2023-08-04 DIAGNOSIS — H02834 Dermatochalasis of left upper eyelid: Secondary | ICD-10-CM

## 2023-08-04 MED ORDER — CYPROHEPTADINE HCL 4 MG PO TABS: 4.0000 mg | ORAL_TABLET | Freq: Every evening | ORAL | 0 refills | Status: DC

## 2023-08-04 NOTE — Progress Notes (Unsigned)
[place] - [place] - [place] - [place] -    NEW PATIENT NOTE  Referring Provider: [facility] Primary Provider: [facility] Date of office visit: 08/04/2023    Subjective:   Chief Complaint:  Alicia Lee (DOB: 10/26/1961) is a 62 y.o. female who presents to the clinic on 08/04/2023 with a chief complaint of Allergic Rhinitis  (Says she has had issues with allergies and they have gotten worse over the years. Says she did do contact testing at her dermatologist. ) .     HPI: Alicia Lee presents to this clinic in evaluation of "allergies".  Her "allergies" are a pain syndrome involving her nasal bridge, cheeks, periorbital region, that appears to occur every morning upon awakening and erodes into the day.  There is no associated scotoma or dizziness or other neurological symptoms.  She ends up taking a Tylenol about 3 times per week.  She does not really have any nasal congestion and or sneezing or ugly nasal discharge or inability to smell or taste.  She has tried multiple antihistamines to treat this issue which has not helped.  She has apparently visited with an ENT doctor who performed a CT scan of her sinuses and she cannot remember what the finding was with that study.  She also has disrupted sleep.  She has fractured sleep and sometimes is awake at 3:00 in the morning and intermittently uses melatonin.  She consumes caffeine with 2 coffees in the morning and does not consume any tea or soda or chocolate but drinks alcohol about twice a week.  She has eyelid drooping.  She believes that the constant effort to keep her eyelids open somehow precipitates this periorbital pain issue that she has and she is scheduled to see an ophthalmologist about possibly undergoing a surgical procedure to remove the excess tissue of her upper eyelid.  She also has some postnasal drip and has a history of reflux that is intermittently active for which she  intermittently uses omeprazole or Mylanta.  Past Medical History:  Diagnosis Date   Bell's palsy    Depression    Hypertension     Past Surgical History:  Procedure Laterality Date   BREAST BIOPSY Left    BREAST REDUCTION SURGERY Bilateral 12/25/2020   Procedure: BILATERAL MAMMARY REDUCTION  (BREAST) with Liposuction;  Surgeon: Contogiannis, Chales Abrahams, MD;  Location: [facility];  Service: Plastics;  Laterality: Bilateral;   BUNIONECTOMY  2015   LIPOSUCTION Right 12/13/2021   Procedure: LIPOSUCTION;  Surgeon: Contogiannis, Chales Abrahams, MD;  Location: [facility];  Service: Plastics;  Laterality: Right;   SHOULDER ARTHROSCOPY Right 2020   SMALL INTESTINE SURGERY  1998   TOE AMPUTATION  1968   TONSILLECTOMY      Allergies as of 08/04/2023   No Known Allergies      Medication List    escitalopram 20 MG tablet Commonly known as: LEXAPRO Take 20 mg by mouth daily.   Ozempic (2 MG/DOSE) 8 MG/3ML Sopn Generic drug: Semaglutide (2 MG/DOSE) Inject 2 mg into the skin once a week.   rosuvastatin 5 MG tablet Commonly known as: CRESTOR Take 5 mg by mouth daily.   zolpidem 5 MG tablet Commonly known as: AMBIEN Take 5 mg by mouth at bedtime as needed.    Review of systems negative except as noted in HPI / PMHx or noted below:  Review of Systems  Constitutional: Negative.   HENT: Negative.    Eyes:  Negative.   Respiratory: Negative.    Cardiovascular: Negative.   Gastrointestinal: Negative.   Genitourinary: Negative.   Musculoskeletal: Negative.   Skin: Negative.   Neurological: Negative.   Endo/Heme/Allergies: Negative.   Psychiatric/Behavioral: Negative.      Family History  Problem Relation Age of Onset   Diabetes Father    Breast cancer Neg Hx     Social History   Socioeconomic History   Marital status: Married    Spouse name: Not on file   Number of children: Not on file   Years of education: Not on file   Highest education level:  Not on file  Occupational History   Not on file  Tobacco Use   Smoking status: Former    Current packs/day: 0.00    Types: Cigarettes    Quit date: 2004    Years since quitting: 20.7    Passive exposure: Never   Smokeless tobacco: Never  Vaping Use   Vaping status: Never Used  Substance and Sexual Activity   Alcohol use: Yes    Alcohol/week: 3.0 standard drinks of alcohol    Types: 1 Glasses of wine, 2 Shots of liquor per week    Comment: social   Drug use: No   Sexual activity: Yes    Birth control/protection: Post-menopausal  Other Topics Concern   Not on file  Social History Narrative   Not on file   Environmental and Social history  Lives in a house with a dry environment, dog located inside the household, no carpet in the bedroom, no plastic on the bed, no plastic on the pillow, no smoking ongoing with inside the household.  Objective:   Vitals:   08/04/23 0941  BP: 130/88  Pulse: 71  Resp: 14  Temp: 98.1 F (36.7 C)  SpO2: 96%   Height: 5' 2.99" (160 cm) Weight: 137 lb 3.2 oz (62.2 kg)  Physical Exam Constitutional:      Appearance: She is not diaphoretic.  HENT:     Head: Normocephalic.     Comments:       Right Ear: Tympanic membrane, ear canal and external ear normal.     Left Ear: Tympanic membrane, ear canal and external ear normal.     Nose: Nose normal. No mucosal edema or rhinorrhea.     Mouth/Throat:     Pharynx: Uvula midline. No oropharyngeal exudate.  Eyes:     Conjunctiva/sclera: Conjunctivae normal.     Comments: Dermatochalasis, periorbital swelling  Neck:     Thyroid: No thyromegaly.     Trachea: Trachea normal. No tracheal tenderness or tracheal deviation.  Cardiovascular:     Rate and Rhythm: Normal rate and regular rhythm.     Heart sounds: Normal heart sounds, S1 normal and S2 normal. No murmur heard. Pulmonary:     Effort: No respiratory distress.     Breath sounds: Normal breath sounds. No stridor. No wheezing or rales.   Lymphadenopathy:     Head:     Right side of head: No tonsillar adenopathy.     Left side of head: No tonsillar adenopathy.     Cervical: No cervical adenopathy.  Skin:    Findings: No erythema or rash.     Nails: There is no clubbing.  Neurological:     Mental Status: She is alert.     Diagnostics: Allergy skin tests were not performed.   Results of a head CT scan obtained 21 April 2017 identified the following:  Sinuses/Orbits: No  acute finding.    Assessment and Plan:    1. Dermatochalasis of both upper eyelids   2. Periorbital edema of both eyes   3. Facial pain syndrome   4. Chronic daily headache   5. Other atopic dermatitis    1. Return for skin testing  2. Visit with ophthalmologist about candidacy for blepharoplasty  3. Blood - TSH, FT4, thyroid peroxidase antibody, thyroglobulin antibody  4. Treat and prevent headaches / facial pain and sleep dysfunction:   A. Slowly minimize all forms of caffeine  B. Periactin 4 mg - 1 tablet at bedtime  5. Further evaluation and treatment ???  Julienne appears to have a facial pain/headache syndrome and were going to have her decrease her exposure to caffeine and start Periactin at bedtime.  We will see if there is an atopic trigger by skin testing her at some point in the near future.  She also has dermatochalasis and she can visit with a ophthalmologist to see if she is a candidate for blepharoplasty.  We will check her thyroid function in investigation of this periorbital issue.  Jessica Priest, MD Allergy / Immunology [facility]

## 2023-08-04 NOTE — Patient Instructions (Addendum)
  1. Return for skin testing  2. Visit with ophthalmologist about candidacy for blepharoplasty  3. Blood - TSH, FT4, thyroid peroxidase antibody, thyroglobulin antibody  4. Treat and prevent headaches / facial pain and sleep dysfunction:   A. Slowly minimize all forms of caffeine  B. Periactin 4 mg - 1 tablet at bedtime  5. Further evaluation and treatment ???

## 2023-08-05 ENCOUNTER — Encounter: Payer: Self-pay | Admitting: Allergy and Immunology

## 2023-08-05 LAB — THYROID PEROXIDASE ANTIBODY: Thyroperoxidase Ab SerPl-aCnc: <9 [IU]/mL (ref 0–34)

## 2023-08-05 LAB — TSH+FREE T4
Free T4: 1.23 ng/dL (ref 0.82–1.77)
TSH: 1.15 u[IU]/mL (ref 0.450–4.500)

## 2023-08-05 LAB — THYROGLOBULIN ANTIBODY: Thyroglobulin Antibody: <1 [IU]/mL (ref 0.0–0.9)

## 2023-08-18 ENCOUNTER — Ambulatory Visit: Payer: 59 | Admitting: Allergy and Immunology

## 2023-08-18 ENCOUNTER — Other Ambulatory Visit: Payer: Self-pay | Admitting: Family Medicine

## 2023-08-18 DIAGNOSIS — Z1231 Encounter for screening mammogram for malignant neoplasm of breast: Secondary | ICD-10-CM

## 2023-08-25 ENCOUNTER — Ambulatory Visit: Payer: 59 | Admitting: Allergy and Immunology

## 2023-09-01 ENCOUNTER — Ambulatory Visit: Payer: 59 | Admitting: Allergy and Immunology

## 2023-09-01 DIAGNOSIS — J3089 Other allergic rhinitis: Secondary | ICD-10-CM | POA: Diagnosis not present

## 2023-09-03 ENCOUNTER — Encounter: Payer: Self-pay | Admitting: Allergy and Immunology

## 2023-09-03 NOTE — Progress Notes (Signed)
Lindaann Return to this clinic to have skin test performed.  Allergy skin test did not identify any significant hypersensitivity against a screening panel of aeroallergens.  She will continue on the previously prescribed treatment plan and we will see her back in this clinic in 4 weeks.

## 2023-09-16 ENCOUNTER — Ambulatory Visit
Admission: RE | Admit: 2023-09-16 | Discharge: 2023-09-16 | Disposition: A | Discharge status: Home / Self Care | Payer: 59 | Source: Ambulatory Visit | Attending: Family Medicine | Admitting: Family Medicine

## 2023-09-16 DIAGNOSIS — Z1231 Encounter for screening mammogram for malignant neoplasm of breast: Secondary | ICD-10-CM

## 2023-10-13 ENCOUNTER — Ambulatory Visit: Payer: 59 | Admitting: Allergy and Immunology

## 2023-10-13 ENCOUNTER — Other Ambulatory Visit: Payer: Self-pay

## 2023-10-13 VITALS — BP 120/82 | HR 69 | Temp 98.2°F | Resp 18 | Wt 138.8 lb

## 2023-10-13 DIAGNOSIS — H02834 Dermatochalasis of left upper eyelid: Secondary | ICD-10-CM

## 2023-10-13 DIAGNOSIS — G4720 Circadian rhythm sleep disorder, unspecified type: Secondary | ICD-10-CM | POA: Diagnosis not present

## 2023-10-13 DIAGNOSIS — G500 Trigeminal neuralgia: Secondary | ICD-10-CM | POA: Diagnosis not present

## 2023-10-13 DIAGNOSIS — R519 Headache, unspecified: Secondary | ICD-10-CM | POA: Diagnosis not present

## 2023-10-13 DIAGNOSIS — H02831 Dermatochalasis of right upper eyelid: Secondary | ICD-10-CM | POA: Diagnosis not present

## 2023-10-13 MED ORDER — CYPROHEPTADINE HCL 4 MG PO TABS: 4.0000 mg | ORAL_TABLET | Freq: Every evening | ORAL | 3 refills | Status: AC

## 2023-10-13 NOTE — Progress Notes (Unsigned)
[place] - [place] - [place] - [place] - [place]   Follow-up Note  Referring Provider: Darrow Bussing, MD Primary Provider: Darrow Bussing, MD Date of Office Visit: 10/13/2023  Subjective:   Alicia Lee (DOB: 06-May-1961) is a 62 y.o. female who [facility] in re-evaluation of the following:  HPI: [name] returns to this clinic in evaluation of facial pain syndrome, sleep dysfunction, chronic daily headache, dermatochalasis.  I last saw her in this clinic 01 September 2023.  She is at least 75% better regarding her facial pain/headache issue and now she does not wake up at nighttime and has good sleep.  She does have vivid dreams but she does not think that these are displeasurable.  She has decreased her coffee consumption to 1 time in the morning.  She is scheduled for blepharoplasty for her dermatochalasis February 2025 at [facility].  Allergies as of 10/13/2023   No Known Allergies      Medication List    cyproheptadine 4 MG tablet Commonly known as: PERIACTIN Take 1 tablet (4 mg total) by mouth at bedtime.   escitalopram 20 MG tablet Commonly known as: LEXAPRO Take 20 mg by mouth daily.   Ozempic (2 MG/DOSE) 8 MG/3ML Sopn Generic drug: Semaglutide (2 MG/DOSE) Inject 2 mg into the skin once a week.   rosuvastatin 5 MG tablet Commonly known as: CRESTOR Take 5 mg by mouth daily.   zolpidem 5 MG tablet Commonly known as: AMBIEN Take 5 mg by mouth at bedtime as needed.    Past Medical History:  Diagnosis Date   Bell's palsy    Depression    Hypertension     Past Surgical History:  Procedure Laterality Date   BREAST BIOPSY Left    BREAST REDUCTION SURGERY Bilateral 12/25/2020   Procedure: BILATERAL MAMMARY REDUCTION  (BREAST) with Liposuction;  Surgeon: Contogiannis, Chales Abrahams, MD;  Location: [facility];  Service: Plastics;  Laterality: Bilateral;   BUNIONECTOMY  2015   LIPOSUCTION  Right 12/13/2021   Procedure: LIPOSUCTION;  Surgeon: Contogiannis, Chales Abrahams, MD;  Location: [facility];  Service: Plastics;  Laterality: Right;   SHOULDER ARTHROSCOPY Right 2020   SMALL INTESTINE SURGERY  1998   TOE AMPUTATION  1968   TONSILLECTOMY      Review of systems negative except as noted in HPI / PMHx or noted below:  Review of Systems  Constitutional: Negative.   HENT: Negative.    Eyes: Negative.   Respiratory: Negative.    Cardiovascular: Negative.   Gastrointestinal: Negative.   Genitourinary: Negative.   Musculoskeletal: Negative.   Skin: Negative.   Neurological: Negative.   Endo/Heme/Allergies: Negative.   Psychiatric/Behavioral: Negative.       Objective:   Vitals:   10/13/23 0834  BP: 120/82  Pulse: 69  Resp: 18  Temp: 98.2 F (36.8 C)  SpO2: 96%      Weight: 138 lb 12.8 oz (63 kg)   Physical Exam Constitutional:      Appearance: She is not diaphoretic.  HENT:     Head: Normocephalic.     Right Ear: Tympanic membrane, ear canal and external ear normal.     Left Ear: Tympanic membrane, ear canal and external ear normal.     Nose: Nose normal. No mucosal edema or rhinorrhea.     Mouth/Throat:     Pharynx: Uvula midline. No oropharyngeal exudate.  Eyes:     Conjunctiva/sclera: Conjunctivae normal.  Comments: Dermatochalasis  Neck:     Thyroid: No thyromegaly.     Trachea: Trachea normal. No tracheal tenderness or tracheal deviation.  Cardiovascular:     Rate and Rhythm: Normal rate and regular rhythm.     Heart sounds: Normal heart sounds, S1 normal and S2 normal. No murmur heard. Pulmonary:     Effort: No respiratory distress.     Breath sounds: Normal breath sounds. No stridor. No wheezing or rales.  Lymphadenopathy:     Head:     Right side of head: No tonsillar adenopathy.     Left side of head: No tonsillar adenopathy.     Cervical: No cervical adenopathy.  Skin:    Findings: No erythema or rash.     Nails: There  is no clubbing.  Neurological:     Mental Status: She is alert.     Diagnostics: Results of blood tests obtained 04 June 2023 identifies TSH 1.150 IU/mL, free T41.23 NG/dL, thyroid peroxidase antibody less than 9U/mL, thyroglobulin antibody less than 1.0U/ML  Assessment and Plan:   1. Facial pain syndrome   2. Chronic daily headache   3. Dysfunction of sleep stage or arousal   4. Dermatochalasis of both upper eyelids    1.  Blepharoplasty February 2025  2. Treat and prevent headaches / facial pain and sleep dysfunction:   A. Slowly minimize all forms of caffeine  B. Periactin 4 mg - 1 tablet at bedtime   3. Return to clinic in 1 year or earlier if problem  Wilmuth is improved by 75% regarding her facial pain/chronic headache and sleep dysfunction issue and she appears to be very happy with the response that she has received while using 4 mg of Periactin before bedtime.  If she would like to try 6 mg at some point in the future she will contact us but otherwise we will see her back in this clinic in 1 year or earlier if there is a problem.  Laurette Schimke, MD Allergy / Immunology [facility]

## 2023-10-13 NOTE — Patient Instructions (Signed)
  1.  Blepharoplasty February 2025  2. Treat and prevent headaches / facial pain and sleep dysfunction:   A. Slowly minimize all forms of caffeine  B. Periactin 4 mg - 1 tablet at bedtime   3. Return to clinic in 1 year or earlier if problem

## 2023-10-14 ENCOUNTER — Encounter: Payer: Self-pay | Admitting: Allergy and Immunology

## 2024-07-21 NOTE — Progress Notes (Deleted)
  Cardiology Office Note   Date:  07/21/2024  ID:  Lasonia Casino, DOB 1960-11-28, MRN [id number] PCP: Regino Slater, MD  [facility] Providers Cardiologist:  None { Click to update primary MD,subspecialty MD or APP then REFRESH:1}    History of Present Illness Glendi Mohiuddin is a 63 y.o. female ***  Tobacco use: *** Alcohol use: *** Activity level: *** Diet mainly consists of: *** Pertinent family history: ***    ROS:  ROS  Physical Exam  Physical Exam  VS:  There were no vitals taken for this visit.        Wt Readings from Last 3 Encounters:  10/13/23 138 lb 12.8 oz (63 kg)  08/04/23 137 lb 3.2 oz (62.2 kg)  12/13/21 158 lb 11.7 oz (72 kg)     EKG Interpretation Date/Time:    Ventricular Rate:    PR Interval:    QRS Duration:    QT Interval:    QTC Calculation:   R Axis:      Text Interpretation:      Studies Reviewed   ***     Risk Assessment/Calculations  {Does this patient have ATRIAL FIBRILLATION?[phone]} No BP recorded.  {Refresh Note OR Click here to enter BP  :1}***        ASCVD risk score: The ASCVD Risk score (Arnett DK, et al., 2019) failed to calculate for the following reasons:   Cannot find a previous HDL lab   Cannot find a previous total cholesterol lab   ASSESSMENT  *** *** *** *** *** *** *** *** *** *** *** ***   Plan  *** Cardiac risk counseling and prevention recommendations: Heart healthy/Mediterranean diet with whole grains, fruits, vegetable, fish, lean meats, nuts, and olive oil. Limit salt. Moderate walking, 3-5 times/week for 30-50 minutes each session. Aim for at least 150 minutes.week. Goal should be pace of 3 miles/hour, or walking 1.5 miles in 30 minutes Avoidance of tobacco products. Avoid excess alcohol.  Follow up: ***     {Are you ordering a CV Procedure (e.g. stress test, cath, DCCV, TEE, etc)?   Press F2        :[id number]}    Signed, Emeline FORBES Calender, MD

## 2024-07-22 ENCOUNTER — Ambulatory Visit: Admitting: Internal Medicine

## 2024-08-16 ENCOUNTER — Other Ambulatory Visit: Payer: Self-pay | Admitting: Family Medicine

## 2024-08-16 DIAGNOSIS — Z1231 Encounter for screening mammogram for malignant neoplasm of breast: Secondary | ICD-10-CM

## 2024-09-19 ENCOUNTER — Encounter: Payer: Self-pay | Admitting: *Deleted

## 2024-09-21 ENCOUNTER — Ambulatory Visit: Attending: Internal Medicine | Admitting: Internal Medicine

## 2024-09-21 ENCOUNTER — Encounter: Payer: Self-pay | Admitting: Internal Medicine

## 2024-09-21 ENCOUNTER — Ambulatory Visit

## 2024-09-21 VITALS — BP 138/84 | HR 63 | Ht 64.0 in | Wt 143.0 lb

## 2024-09-21 DIAGNOSIS — R002 Palpitations: Secondary | ICD-10-CM

## 2024-09-21 DIAGNOSIS — I498 Other specified cardiac arrhythmias: Secondary | ICD-10-CM

## 2024-09-21 DIAGNOSIS — I10 Essential (primary) hypertension: Secondary | ICD-10-CM | POA: Diagnosis not present

## 2024-09-21 DIAGNOSIS — F5104 Psychophysiologic insomnia: Secondary | ICD-10-CM

## 2024-09-21 NOTE — Progress Notes (Signed)
 Cardiology Office Note   Date:  09/21/2024  ID:  Mikeya Tomasetti, DOB [birth year], MRN [id number] PCP: Regino Slater, MD  [facility] Providers Cardiologist:  Emeline FORBES Calender, DO     History of Present Illness Alicia Lee is a 63 y.o. female with a past medical history of hypertension, depression, chronic fatigue, migraines who was referred by her PCP for heart fluttering.  TSH was 1.61 and troponin was negative.  She states she has been having intermittent palpitations which were occurring more frequently in the summertime while she was playing tennis in the heat.  She even had some presyncopal and possibly syncopal episodes during that time.  She was trying to stay hydrated but still had the symptoms ongoing.  Since the seasons have changed her symptoms have improved but she still on occasion gets these palpitations.  No more syncope or presyncopal symptoms.  She has been having a lot more stress lately and her husband was recently diagnosed with prostate cancer.  She has not been sleeping at all and has been up since 230 last night.  She is also going on a trip to [place] this weekend.  She drinks 1 cup of coffee in the morning and does not have any other caffeine throughout the day.  She also was recently sick with a viral URI.  She has been noting elevated blood pressures and this morning had a systolic in the 150s that has improved since taking her morning antihypertensives.  No other complaints.  Denies tobacco.     ROS:  Review of Systems  All other systems reviewed and are negative.   Physical Exam  Physical Exam Vitals and nursing note reviewed.  Constitutional:      Appearance: Normal appearance.  HENT:     Head: Normocephalic and atraumatic.  Eyes:     Conjunctiva/sclera: Conjunctivae normal.  Neck:     Vascular: No carotid bruit.  Cardiovascular:     Rate and Rhythm: Normal rate and regular rhythm.  Pulmonary:     Effort: Pulmonary effort is normal.      Breath sounds: Normal breath sounds.  Musculoskeletal:        General: No swelling or tenderness.  Skin:    Coloration: Skin is not jaundiced or pale.  Neurological:     Mental Status: She is alert.     VS:  BP 138/84 (BP Location: Left Arm, Patient Position: Sitting, Cuff Size: Normal)   Pulse 63   Ht 5' 4 (1.626 m)   Wt 143 lb (64.9 kg)   SpO2 97%   BMI 24.55 kg/m         Wt Readings from Last 3 Encounters:  09/21/24 143 lb (64.9 kg)  10/13/23 138 lb 12.8 oz (63 kg)  08/04/23 137 lb 3.2 oz (62.2 kg)     EKG Interpretation Date/Time:  Wednesday September 21 2024 11:30:12 EST Ventricular Rate:  63 PR Interval:  168 QRS Duration:  74 QT Interval:  422 QTC Calculation: 431 R Axis:   83  Text Interpretation: Normal sinus rhythm Septal infarct , age undetermined When compared with ECG of 20-Dec-2020 11:28, Septal infarct is now Present Confirmed by Calender Emeline [phone]) on 09/21/2024 11:34:57 AM    Studies Reviewed   Echocardiogram 07/08/2022:  1. Left ventricular ejection fraction, by estimation, is 60 to 65%. Left  ventricular ejection fraction by 3D volume is 61 %. The left ventricle has  normal function. The left ventricle has no regional wall motion  abnormalities.  Left ventricular diastolic   parameters are consistent with Grade I diastolic dysfunction (impaired  relaxation). The average left ventricular global longitudinal strain is  -24.5 %. The global longitudinal strain is normal.   2. Right ventricular systolic function is normal. The right ventricular  size is normal.   3. The mitral valve is normal in structure. No evidence of mitral valve  regurgitation. No evidence of mitral stenosis.   4. The aortic valve is normal in structure. Aortic valve regurgitation is  not visualized. No aortic stenosis is present.   5. The inferior vena cava is normal in size with greater than 50%  respiratory variability, suggesting right atrial pressure of 3 mmHg.        Risk Assessment/Calculations             ASCVD risk score: The ASCVD Risk score (Arnett DK, et al., 2019) failed to calculate for the following reasons:   Cannot find a previous HDL lab   Cannot find a previous total cholesterol lab   ASSESSMENT/PLAN  Intermittent palpitations 2-week Zio patch once she comes back from her [place] trip 10/03/2024.  Patient does not wish to pursue an echocardiogram at this time which is reasonable as it will likely be unrevealing. 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  Currently on Ambien.  Encouraged improved sleep hygiene however this is likely driven by anxiety Hypertension continue current antihypertensive.  PCP managing Presyncope and possible syncope occurred during the summer.  Most likely due to dehydration but cannot rule out arrhythmogenic cause.  Will follow-up on Zio patch.    Follow up: 3 months with PA          Signed, Emeline FORBES Calender, DO

## 2024-09-21 NOTE — Progress Notes (Unsigned)
 Enrolled patient for a 14 day Zio XT  monitor to be mailed to patients home

## 2024-09-21 NOTE — Patient Instructions (Signed)
 Medication Instructions:  No medication changes were made at this visit. Continue current regimen.  *If you need a refill on your cardiac medications before your next appointment, please call your pharmacy*  Testing/Procedures: Your physician has requested that you wear a Zio heart monitor for 14 days. This will be mailed to your home with instructions on how to apply the monitor and how to return it when finished. Please allow 2 weeks after returning the heart monitor before our office calls you with the results.    Follow-Up: At [facility], you and your health needs are our priority.  As part of our continuing mission to provide you with exceptional heart care, our providers are all part of one team.  This team includes your primary Cardiologist (physician) and Advanced Practice Providers or APPs (Physician Assistants and Nurse Practitioners) who all work together to provide you with the care you need, when you need it.  Your next appointment:   3 month(s)  Provider:   One of our Advanced Practice Providers (APPs): Morse Clause, PA-C  Lamarr Satterfield, NP Miriam Shams, NP  Olivia Pavy, PA-C Josefa Beauvais, NP  Leontine Salen, PA-C Orren Fabry, PA-C  Hao Meng, PA-C Ernest Dick, NP  Damien Braver, NP Jon Hails, PA-C  Waddell Donath, PA-C    Dayna Dunn, PA-C  Scott Weaver, PA-C Lum Louis, NP Katlyn West, NP Callie Goodrich, PA-C  Xika Zhao, NP Sheng Haley, PA-C    Kathleen Johnson, PA-C   We recommend signing up for the patient portal called MyChart.  Sign up information is provided on this After Visit Summary.  MyChart is used to connect with patients for Virtual Visits (Telemedicine).  Patients are able to view lab/test results, encounter notes, upcoming appointments, etc.  Non-urgent messages can be sent to your provider as well.   To learn more about what you can do with MyChart, go to [email or web address].   Other Instructions ZIO XT- Long Term Monitor  Instructions Your physician has requested you wear a ZIO patch monitor for 14 days.  This is a single patch monitor. [employer] supplies one patch monitor per enrollment. Additional stickers are not available. Please do not apply patch if you will be having a Nuclear Stress Test, Echocardiogram, Cardiac CT, MRI, or Chest Xray during the period you would be wearing the monitor. The patch cannot be worn during these tests. You cannot remove and re-apply the ZIO XT patch monitor.  Your ZIO patch monitor will be mailed 3 day USPS to your address on file. It may take 3-5 days to receive your monitor after you have been enrolled.  Once you have received your monitor, please review the enclosed instructions. Your monitor has already been registered assigning a specific monitor serial # to you.    Billing and Patient Assistance Program Information We have supplied [employer] with any of your insurance information on file for billing purposes. [employer] offers a sliding scale Patient Assistance Program for patients that do not have insurance, or whose insurance does not completely cover the cost of the ZIO monitor. You must apply for the Patient Assistance Program to qualify for this discounted rate.  To apply, please call [employer] at [phone], select option 4, select option 2, ask to apply for Patient Assistance Program. Meredeth will ask your household income, and how many people are in your household. They will quote your out-of-pocket cost based on that information. [employer] will also be able to set up a 56-month, interest-free payment plan if needed.  Applying the monitor  Shave hair from upper left chest.  Hold abrader disc by orange tab. Rub abrader in 40 strokes over the upper left chest as  indicated in your monitor instructions.  Clean area with 4 enclosed alcohol pads. Let dry.  Apply patch as indicated in monitor instructions. Patch will be placed under collarbone on left  side of  chest with arrow pointing upward.  Rub patch adhesive wings for 2 minutes. Remove white label marked 1. Remove the white  label marked 2. Rub patch adhesive wings for 2 additional minutes.  While looking in a mirror, press and release button in center of patch. A small green light will  flash 3-4 times. This will be your only indicator that the monitor has been turned on.  Do not shower for the first 24 hours. You may shower after the first 24 hours.  Press the button if you feel a symptom. You will hear a small click. Record Date, Time and  Symptom in the Patient Logbook.  When you are ready to remove the patch, follow instructions on the last 2 pages of Patient Logbook. Stick patch monitor onto the last page of Patient Logbook.  Place Patient Logbook in the blue and white box. Use locking tab on box and tape box closed securely. The blue and white box has prepaid postage on it. Please place it in the mailbox as soon as possible. Your physician should have your test results approximately 7 days after the monitor has been mailed back to [facility].  Call [facility] Customer Care at [phone] if you have questions regarding your ZIO XT patch monitor. Call them immediately if you see an orange light blinking on your monitor.  If your monitor falls off in less than 4 days, contact our Monitor department at [phone].  If your monitor becomes loose or falls off after 4 days call [employer] at [phone] for suggestions on securing your monitor.

## 2024-10-03 ENCOUNTER — Telehealth: Payer: Self-pay | Admitting: Internal Medicine

## 2024-10-03 NOTE — Telephone Encounter (Signed)
 Pt called in asking if someone can provide an update on her getting her heart monitor. Please advise.

## 2024-10-04 ENCOUNTER — Ambulatory Visit
Admission: RE | Admit: 2024-10-04 | Discharge: 2024-10-04 | Disposition: A | Source: Ambulatory Visit | Attending: Family Medicine | Admitting: Family Medicine

## 2024-10-04 DIAGNOSIS — Z1231 Encounter for screening mammogram for malignant neoplasm of breast: Secondary | ICD-10-CM

## 2024-10-04 NOTE — Telephone Encounter (Signed)
 Returned call to patient. She has received her monitor and will apply it tom

## 2024-11-03 DIAGNOSIS — I498 Other specified cardiac arrhythmias: Secondary | ICD-10-CM | POA: Diagnosis not present

## 2024-11-03 DIAGNOSIS — R002 Palpitations: Secondary | ICD-10-CM

## 2024-11-04 ENCOUNTER — Ambulatory Visit: Payer: Self-pay | Admitting: Internal Medicine

## 2024-11-04 DIAGNOSIS — I471 Supraventricular tachycardia, unspecified: Secondary | ICD-10-CM

## 2024-11-04 MED ORDER — METOPROLOL SUCCINATE ER 25 MG PO TB24: 25.0000 mg | ORAL_TABLET | Freq: Every day | ORAL | 2 refills | Status: AC

## 2024-11-04 NOTE — Progress Notes (Signed)
"    Patch Wear Time:  14 days and 0 hours   HR 53-190, average 73 bpm   Predominantly sinus rhythm   35 episodes of SVT, mostly asymptomatic   Fastest episode of SVT lasted 6.8 seconds and was symptomatic   Triggered events correspond to sinus tachycardia and some SVT episodes   Rare PACs and PVCs   No atrial fibrillation  She can start on metoprolol  succinate 25 mg daily.  Has follow-up scheduled February 25.  "

## 2024-11-05 ENCOUNTER — Other Ambulatory Visit: Payer: Self-pay | Admitting: Allergy and Immunology

## 2024-12-16 ENCOUNTER — Ambulatory Visit: Payer: Self-pay | Admitting: Family Medicine

## 2024-12-21 ENCOUNTER — Ambulatory Visit: Payer: Self-pay | Admitting: Physician Assistant
